# Patient Record
Sex: Male | Born: 1975
Health system: Southern US, Community
[De-identification: ages and names within clinical notes are randomized; demographics above are authoritative.]

## PROBLEM LIST (undated history)

## (undated) DIAGNOSIS — F32A Depression, unspecified: Secondary | ICD-10-CM

## (undated) DIAGNOSIS — F419 Anxiety disorder, unspecified: Secondary | ICD-10-CM

## (undated) DIAGNOSIS — F329 Major depressive disorder, single episode, unspecified: Secondary | ICD-10-CM

## (undated) HISTORY — DX: Anxiety disorder, unspecified: F41.9

## (undated) HISTORY — DX: Major depressive disorder, single episode, unspecified: F32.9

## (undated) HISTORY — DX: Depression, unspecified: F32.A

## (undated) HISTORY — PX: NO PAST SURGERIES: SHX2092

---

## 2004-01-03 ENCOUNTER — Emergency Department (HOSPITAL_COMMUNITY): Admission: EM | Admit: 2004-01-03 | Discharge: 2004-01-03 | Payer: Self-pay | Admitting: Emergency Medicine

## 2005-02-07 ENCOUNTER — Observation Stay (HOSPITAL_COMMUNITY): Admission: EM | Admit: 2005-02-07 | Discharge: 2005-02-08 | Payer: Self-pay | Admitting: Emergency Medicine

## 2005-02-08 ENCOUNTER — Ambulatory Visit: Payer: Self-pay | Admitting: Psychiatry

## 2005-02-08 ENCOUNTER — Inpatient Hospital Stay (HOSPITAL_COMMUNITY): Admission: RE | Admit: 2005-02-08 | Discharge: 2005-02-11 | Payer: Self-pay | Admitting: Psychiatry

## 2013-02-13 DIAGNOSIS — F419 Anxiety disorder, unspecified: Secondary | ICD-10-CM | POA: Insufficient documentation

## 2018-02-18 ENCOUNTER — Ambulatory Visit: Payer: BLUE CROSS/BLUE SHIELD | Admitting: Sports Medicine

## 2018-02-18 ENCOUNTER — Encounter: Payer: Self-pay | Admitting: Sports Medicine

## 2018-02-18 VITALS — BP 121/78 | HR 67 | Resp 16 | Ht 72.0 in | Wt 199.0 lb

## 2018-02-18 DIAGNOSIS — Z0000 Encounter for general adult medical examination without abnormal findings: Secondary | ICD-10-CM | POA: Diagnosis not present

## 2018-02-18 DIAGNOSIS — Z23 Encounter for immunization: Secondary | ICD-10-CM

## 2018-02-18 DIAGNOSIS — F411 Generalized anxiety disorder: Secondary | ICD-10-CM

## 2018-02-18 NOTE — Assessment & Plan Note (Signed)
Tdap today, declines flu shot, routine labs ordered. Return in 1 year for an annual physical.

## 2018-02-18 NOTE — Assessment & Plan Note (Addendum)
Continue Lexapro, he is unsure of his dose, he is going to get home and let me know.  I am happy to give him a year supply. Historically prescribed by psychiatrist, I think this is well within our realm to manage. Looking at his scores it does look like we have room for improvement.

## 2018-02-18 NOTE — Addendum Note (Signed)
Addended by: Baird KayUGLAS, [place] Kinnett M on: 02/18/2018 05:09 PM   Modules accepted: Orders

## 2018-02-18 NOTE — Progress Notes (Addendum)
  Subjective:    CC: Establish care.   HPI:  This is a pleasant 42 year old male, he really has no complaints, he has not seen a physician in years, and would like to establish care.  He has no complaints, he does take Lexapro for some generalized anxiety, prescribed by psychiatrist, well-controlled, no suicidal or homicidal ideation.  No depressed mood.  I reviewed the past medical history, family history, social history, surgical history, and allergies today and no changes were needed.  Please see the problem list section below in epic for further details.  Past Medical History: Past Medical History:  Diagnosis Date  . Anxiety and depression    Past Surgical History: Past Surgical History:  Procedure Laterality Date  . NO PAST SURGERIES     Social History: Social History   Socioeconomic History  . Marital status: Married    Spouse name: None  . Number of children: None  . Years of education: None  . Highest education level: None  Social Needs  . Financial resource strain: None  . Food insecurity - worry: None  . Food insecurity - inability: None  . Transportation needs - medical: None  . Transportation needs - non-medical: None  Occupational History  . None  Tobacco Use  . Smoking status: None  Substance and Sexual Activity  . Alcohol use: None  . Drug use: None  . Sexual activity: None  Other Topics Concern  . None  Social History Narrative  . None   Family History: History reviewed. No pertinent family history. Allergies: Not on File Medications: See med rec.  Review of Systems: No headache, visual changes, nausea, vomiting, diarrhea, constipation, dizziness, abdominal pain, skin rash, fevers, chills, night sweats, swollen lymph nodes, weight loss, chest pain, body aches, joint swelling, muscle aches, shortness of breath, mood changes, visual or auditory hallucinations.  Objective:    General: Well Developed, well nourished, and in no acute distress.    Neuro: Alert and oriented x3, extra-ocular muscles intact, sensation grossly intact. Cranial nerves II through XII are intact, motor, sensory, and coordinative functions are all intact. HEENT: Normocephalic, atraumatic, pupils equal round reactive to light, neck supple, no masses, no lymphadenopathy, thyroid nonpalpable. Oropharynx, nasopharynx, external ear canals are unremarkable. Skin: Warm and dry, no rashes noted.  Cardiac: Regular rate and rhythm, no murmurs rubs or gallops.  Respiratory: Clear to auscultation bilaterally. Not using accessory muscles, speaking in full sentences.  Abdominal: Soft, nontender, nondistended, positive bowel sounds, no masses, no organomegaly.  Musculoskeletal: Shoulder, elbow, wrist, hip, knee, ankle stable, and with full range of motion.  Impression and Recommendations:    The patient was counselled, risk factors were discussed, anticipatory guidance given.  Anxiety, generalized Continue Lexapro, he is unsure of his dose, he is going to get home and let me know.  I am happy to give him a year supply. Historically prescribed by psychiatrist, I think this is well within our realm to manage. Looking at his scores it does look like we have room for improvement.  Annual physical exam Tdap today, declines flu shot, routine labs ordered. Return in 1 year for an annual physical. ___________________________________________ Ihor Austinhomas J. Benjamin Stainhekkekandam, M.D., ABFM., CAQSM. Primary Care and Sports Medicine [facility]  Adjunct Instructor of Family Medicine  [employer]

## 2018-03-05 LAB — COMPREHENSIVE METABOLIC PANEL
AG Ratio: 2.2 (calc) (ref 1.0–2.5)
ALT: 14 U/L (ref 9–46)
AST: 15 U/L (ref 10–40)
Albumin: 4.3 g/dL (ref 3.6–5.1)
Alkaline phosphatase (APISO): 50 U/L (ref 40–115)
BUN: 22 mg/dL (ref 7–25)
CO2: 28 mmol/L (ref 20–32)
Calcium: 9.1 mg/dL (ref 8.6–10.3)
Chloride: 104 mmol/L (ref 98–110)
Creat: 0.99 mg/dL (ref 0.60–1.35)
Globulin: 2 g/dL (calc) (ref 1.9–3.7)
Glucose, Bld: 98 mg/dL (ref 65–99)
Potassium: 4.5 mmol/L (ref 3.5–5.3)
Sodium: 137 mmol/L (ref 135–146)
Total Bilirubin: 0.3 mg/dL (ref 0.2–1.2)
Total Protein: 6.3 g/dL (ref 6.1–8.1)

## 2018-03-05 LAB — CBC
HCT: 40.1 % (ref 38.5–50.0)
Hemoglobin: 13.4 g/dL (ref 13.2–17.1)
MCH: 29 pg (ref 27.0–33.0)
MCHC: 33.4 g/dL (ref 32.0–36.0)
MCV: 86.8 fL (ref 80.0–100.0)
MPV: 12.1 fL (ref 7.5–12.5)
Platelets: 223 10*3/uL (ref 140–400)
RBC: 4.62 10*6/uL (ref 4.20–5.80)
RDW: 11.7 % (ref 11.0–15.0)
WBC: 4.7 10*3/uL (ref 3.8–10.8)

## 2018-03-05 LAB — TESTOSTERONE, FREE & TOTAL
Free Testosterone: 62.4 pg/mL (ref 35.0–155.0)
Testosterone, Total, LC-MS-MS: 390 ng/dL (ref 250–1100)

## 2018-03-05 LAB — HEMOGLOBIN A1C
Hgb A1c MFr Bld: 5.1 % of total Hgb (ref <5.7)
Mean Plasma Glucose: 100 (calc)
eAG (mmol/L): 5.5 (calc)

## 2018-03-05 LAB — LIPID PANEL W/REFLEX DIRECT LDL
Cholesterol: 209 mg/dL — ABNORMAL HIGH (ref <200)
HDL: 51 mg/dL (ref >40)
LDL Cholesterol (Calc): 125 mg/dL (calc) — ABNORMAL HIGH
Non-HDL Cholesterol (Calc): 158 mg/dL (calc) — ABNORMAL HIGH (ref <130)
Total CHOL/HDL Ratio: 4.1 (calc) (ref <5.0)
Triglycerides: 213 mg/dL — ABNORMAL HIGH (ref <150)

## 2018-03-05 LAB — TSH: TSH: 2.77 mIU/L (ref 0.40–4.50)

## 2018-03-05 LAB — VITAMIN D 25 HYDROXY (VIT D DEFICIENCY, FRACTURES): Vit D, 25-Hydroxy: 26 ng/mL — ABNORMAL LOW (ref 30–100)

## 2018-03-06 MED ORDER — VITAMIN D (ERGOCALCIFEROL) 1.25 MG (50000 UNIT) PO CAPS
50000.0000 [IU] | ORAL_CAPSULE | ORAL | 0 refills | Status: DC
Start: 2018-03-06 — End: 2019-07-28

## 2018-03-06 NOTE — Addendum Note (Signed)
Addended by: Monica BectonHEKKEKANDAM, THOMAS J on: 03/06/2018 11:57 AM   Modules accepted: Orders

## 2018-05-06 ENCOUNTER — Other Ambulatory Visit: Payer: Self-pay | Admitting: Sports Medicine

## 2018-10-08 ENCOUNTER — Telehealth: Payer: Self-pay

## 2018-10-08 MED ORDER — ESCITALOPRAM OXALATE 10 MG PO TABS: 10.0000 mg | ORAL_TABLET | Freq: Every day | ORAL | 3 refills | Status: DC

## 2018-10-08 NOTE — Telephone Encounter (Signed)
Done

## 2018-10-08 NOTE — Telephone Encounter (Signed)
New patient to Dr T., wanting to know if Dr Judieth Keens take over his RX for Lexapro 10mg , 1 QD   [facility]ing [facility]. RX pended for approval

## 2019-01-25 ENCOUNTER — Other Ambulatory Visit: Payer: Self-pay | Admitting: Sports Medicine

## 2019-07-28 ENCOUNTER — Encounter: Payer: Self-pay | Admitting: Sports Medicine

## 2019-07-28 ENCOUNTER — Ambulatory Visit (INDEPENDENT_AMBULATORY_CARE_PROVIDER_SITE_OTHER): Payer: Self-pay | Admitting: Sports Medicine

## 2019-07-28 ENCOUNTER — Other Ambulatory Visit: Payer: Self-pay

## 2019-07-28 DIAGNOSIS — Z0000 Encounter for general adult medical examination without abnormal findings: Secondary | ICD-10-CM

## 2019-07-28 DIAGNOSIS — F411 Generalized anxiety disorder: Secondary | ICD-10-CM

## 2019-07-28 MED ORDER — SERTRALINE HCL 50 MG PO TABS: ORAL_TABLET | ORAL | 3 refills | Status: DC

## 2019-07-28 NOTE — Patient Instructions (Signed)
1 tab of Lexapro daily for 4 days then one half tab daily for 4 days then stop.  Starting Zoloft one half tab daily for 4 days and then 1 tab p.o. daily.

## 2019-07-28 NOTE — Progress Notes (Signed)
Subjective:    CC: Follow-up  HPI: Tyler Murray is a pleasant 43 year old male with severe anxiety, he has been historically stable and well-controlled on Lexapro 20.  Recently [place] had a breakdown[place].  No suicidal or homicidal ideation, his anxiety is worsening with the current pandemic.  I reviewed the past medical history, family history, social history, surgical history, and allergies today and no changes were needed.  Please see the problem list section below in epic for further details.  Past Medical History: Past Medical History:  Diagnosis Date  . Anxiety and depression    Past Surgical History: Past Surgical History:  Procedure Laterality Date  . NO PAST SURGERIES     Social History: Social History   Socioeconomic History  . Marital status: Unknown    Spouse name: Not on file  . Number of children: 2  . Years of education: 912  . Highest education level: Not on file  Occupational History  . Occupation: Leisure centre managerBartender  Social Needs  . Financial resource strain: Not on file  . Food insecurity    Worry: Not on file    Inability: Not on file  . Transportation needs    Medical: Not on file    Non-medical: Not on file  Tobacco Use  . Smoking status: Never Smoker  . Smokeless tobacco: Never Used  Substance and Sexual Activity  . Alcohol use: Yes  . Drug use: No  . Sexual activity: Yes    Birth control/protection: None  Lifestyle  . Physical activity    Days per week: Not on file    Minutes per session: Not on file  . Stress: Not on file  Relationships  . Social Musicianconnections    Talks on phone: Not on file    Gets together: Not on file    Attends religious service: Not on file    Active member of club or organization: Not on file    Attends meetings of clubs or organizations: Not on file    Relationship status: Not on file  Other Topics Concern  . Not on file  Social History Narrative  . Not on file   Family History:  Family History  Problem Relation Age of Onset  . Depression Father    Allergies: No Known Allergies Medications: See med rec.  Review of Systems: No fevers, chills, night sweats, weight loss, chest pain, or shortness of breath.   Objective:    General: Well Developed, well nourished, and in no acute distress.  Neuro: Alert and oriented x3, extra-ocular muscles intact, sensation grossly intact.  HEENT: Normocephalic, atraumatic, pupils equal round reactive to light, neck supple, no masses, no lymphadenopathy, thyroid nonpalpable.  Skin: Warm and dry, no rashes. Cardiac: Regular rate and rhythm, no murmurs rubs or gallops, no lower extremity edema.  Respiratory: Clear to auscultation bilaterally. Not using accessory muscles, speaking in full sentences.  Impression and Recommendations:    Anxiety, generalized Starting to develop some tachyphylaxis with Lexapro. He is doing a full 20 mg 2 tabs daily. We are to drop him down and switch him to Zoloft. He will do 1 tab of Lexapro daily for 4 days then one half tab daily for 4 days then stop. Starting Zoloft one half tab daily for 4 days and then 1 tab p.o. daily. Return in 6 weeks for repeat PHQ and GAD.  I spent 25 minutes with this patient, greater than 50% was face-to-face time counseling regarding the  above diagnoses.  ___________________________________________ Gwen Her. Dianah Field, M.D., ABFM., CAQSM. Primary Care and Sports Medicine [facility]  Adjunct Professor of [place] [employer]

## 2019-07-28 NOTE — Assessment & Plan Note (Signed)
Starting to develop some tachyphylaxis with Lexapro. He is doing a full 20 mg 2 tabs daily. We are to drop him down and switch him to Zoloft. He will do 1 tab of Lexapro daily for 4 days then one half tab daily for 4 days then stop. Starting Zoloft one half tab daily for 4 days and then 1 tab p.o. daily. Return in 6 weeks for repeat PHQ and GAD.

## 2019-07-29 ENCOUNTER — Emergency Department (INDEPENDENT_AMBULATORY_CARE_PROVIDER_SITE_OTHER)
Admission: EM | Admit: 2019-07-29 | Discharge: 2019-07-29 | Disposition: A | Discharge status: Home / Self Care | Payer: Self-pay | Source: Home / Self Care

## 2019-07-29 ENCOUNTER — Encounter: Payer: Self-pay | Admitting: *Deleted

## 2019-07-29 ENCOUNTER — Other Ambulatory Visit: Payer: Self-pay

## 2019-07-29 ENCOUNTER — Emergency Department (INDEPENDENT_AMBULATORY_CARE_PROVIDER_SITE_OTHER): Payer: Self-pay

## 2019-07-29 DIAGNOSIS — M549 Dorsalgia, unspecified: Secondary | ICD-10-CM

## 2019-07-29 DIAGNOSIS — M79642 Pain in left hand: Secondary | ICD-10-CM

## 2019-07-29 DIAGNOSIS — S6392XA Sprain of unspecified part of left wrist and hand, initial encounter: Secondary | ICD-10-CM

## 2019-07-29 DIAGNOSIS — S63602A Unspecified sprain of left thumb, initial encounter: Secondary | ICD-10-CM

## 2019-07-29 LAB — COMPLETE METABOLIC PANEL WITH GFR
AG Ratio: 2.4 (calc) (ref 1.0–2.5)
ALT: 14 U/L (ref 9–46)
AST: 15 U/L (ref 10–40)
Albumin: 4.4 g/dL (ref 3.6–5.1)
Alkaline phosphatase (APISO): 45 U/L (ref 36–130)
BUN: 14 mg/dL (ref 7–25)
CO2: 27 mmol/L (ref 20–32)
Calcium: 9.4 mg/dL (ref 8.6–10.3)
Chloride: 105 mmol/L (ref 98–110)
Creat: 0.86 mg/dL (ref 0.60–1.35)
GFR, Est African American: 124 mL/min/{1.73_m2} (ref >=60)
GFR, Est Non African American: 107 mL/min/{1.73_m2} (ref >=60)
Globulin: 1.8 g/dL (calc) — ABNORMAL LOW (ref 1.9–3.7)
Glucose, Bld: 105 mg/dL — ABNORMAL HIGH (ref 65–99)
Potassium: 4.7 mmol/L (ref 3.5–5.3)
Sodium: 140 mmol/L (ref 135–146)
Total Bilirubin: 0.5 mg/dL (ref 0.2–1.2)
Total Protein: 6.2 g/dL (ref 6.1–8.1)

## 2019-07-29 LAB — CBC
HCT: 40.4 % (ref 38.5–50.0)
Hemoglobin: 13.4 g/dL (ref 13.2–17.1)
MCH: 29.8 pg (ref 27.0–33.0)
MCHC: 33.2 g/dL (ref 32.0–36.0)
MCV: 89.8 fL (ref 80.0–100.0)
MPV: 12.4 fL (ref 7.5–12.5)
Platelets: 216 10*3/uL (ref 140–400)
RBC: 4.5 10*6/uL (ref 4.20–5.80)
RDW: 11.8 % (ref 11.0–15.0)
WBC: 3.9 10*3/uL (ref 3.8–10.8)

## 2019-07-29 LAB — LIPID PANEL W/REFLEX DIRECT LDL
Cholesterol: 236 mg/dL — ABNORMAL HIGH (ref <200)
HDL: 56 mg/dL (ref >=40)
LDL Cholesterol (Calc): 153 mg/dL (calc) — ABNORMAL HIGH
Non-HDL Cholesterol (Calc): 180 mg/dL (calc) — ABNORMAL HIGH (ref <130)
Total CHOL/HDL Ratio: 4.2 (calc) (ref <5.0)
Triglycerides: 143 mg/dL (ref <150)

## 2019-07-29 LAB — TSH: TSH: 1.93 mIU/L (ref 0.40–4.50)

## 2019-07-29 LAB — HEMOGLOBIN A1C
Hgb A1c MFr Bld: 5.2 % of total Hgb (ref <5.7)
Mean Plasma Glucose: 103 (calc)
eAG (mmol/L): 5.7 (calc)

## 2019-07-29 LAB — VITAMIN D 25 HYDROXY (VIT D DEFICIENCY, FRACTURES): Vit D, 25-Hydroxy: 33 ng/mL (ref 30–100)

## 2019-07-29 MED ORDER — CYCLOBENZAPRINE HCL 5 MG PO TABS: 5.0000 mg | ORAL_TABLET | Freq: Two times a day (BID) | ORAL | 0 refills | Status: DC | PRN

## 2019-07-29 NOTE — Discharge Instructions (Signed)
°  Flexeril (cyclobenzaprine) is a muscle relaxer and may cause drowsiness. Do not drink alcohol, drive, or operate heavy machinery while taking.  You may take 500mg  acetaminophen every 4-6 hours or in combination with ibuprofen 400-600mg  every 6-8 hours as needed for pain and inflammation.  Use caution when taking the ibuprofen with your Zoloft as it can increase your risk of a stomach ulcer or bleed.  Be sure to take with food.   Please call to schedule a follow up appointment with Dr. Dianah Field, Sports Medicine, later this week or early next week if hand/thumb pain not improving as additional imaging or treatment may be indicated.
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
## 2019-07-30 ENCOUNTER — Telehealth: Payer: Self-pay

## 2019-07-30 DIAGNOSIS — F411 Generalized anxiety disorder: Secondary | ICD-10-CM

## 2019-07-30 MED ORDER — ALPRAZOLAM 0.25 MG PO TABS: 0.2500 mg | ORAL_TABLET | Freq: Every day | ORAL | 0 refills | Status: DC | PRN

## 2019-07-30 NOTE — Telephone Encounter (Signed)
Patient has been advised. Rhonda Cunningham,CMA  

## 2019-07-30 NOTE — Telephone Encounter (Signed)
Definitely, I must of forgot to send it in.

## 2019-07-30 NOTE — Telephone Encounter (Signed)
Patient called stating he thought that Dr T told him he was going to send him some Xanax to the pharmacy or breakthrough anxiety.   [facility]ing [facility]

## 2019-08-14 ENCOUNTER — Encounter: Payer: Self-pay | Admitting: Sports Medicine

## 2019-08-14 ENCOUNTER — Ambulatory Visit (INDEPENDENT_AMBULATORY_CARE_PROVIDER_SITE_OTHER): Payer: Self-pay | Admitting: Sports Medicine

## 2019-08-14 ENCOUNTER — Other Ambulatory Visit: Payer: Self-pay

## 2019-08-14 ENCOUNTER — Ambulatory Visit (INDEPENDENT_AMBULATORY_CARE_PROVIDER_SITE_OTHER): Payer: Self-pay

## 2019-08-14 DIAGNOSIS — S6992XA Unspecified injury of left wrist, hand and finger(s), initial encounter: Secondary | ICD-10-CM

## 2019-08-14 MED ORDER — CELECOXIB 200 MG PO CAPS: ORAL_CAPSULE | ORAL | 2 refills | Status: DC
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
## 2019-08-14 NOTE — Addendum Note (Signed)
Addended by: Silverio Decamp on: 08/14/2019 04:44 PM   Modules accepted: Orders

## 2019-08-20 ENCOUNTER — Other Ambulatory Visit: Payer: Self-pay | Admitting: Sports Medicine

## 2019-08-20 DIAGNOSIS — F411 Generalized anxiety disorder: Secondary | ICD-10-CM

## 2019-09-03 ENCOUNTER — Other Ambulatory Visit: Payer: Self-pay | Admitting: Sports Medicine

## 2019-09-03 DIAGNOSIS — F411 Generalized anxiety disorder: Secondary | ICD-10-CM

## 2019-09-03 NOTE — Telephone Encounter (Signed)
[facility]  

## 2019-09-08 ENCOUNTER — Ambulatory Visit (INDEPENDENT_AMBULATORY_CARE_PROVIDER_SITE_OTHER): Payer: BLUE CROSS/BLUE SHIELD | Admitting: Sports Medicine

## 2019-09-08 ENCOUNTER — Other Ambulatory Visit: Payer: Self-pay

## 2019-09-08 ENCOUNTER — Encounter: Payer: Self-pay | Admitting: Sports Medicine

## 2019-09-08 DIAGNOSIS — S6992XD Unspecified injury of left wrist, hand and finger(s), subsequent encounter: Secondary | ICD-10-CM | POA: Diagnosis not present

## 2019-09-08 DIAGNOSIS — F411 Generalized anxiety disorder: Secondary | ICD-10-CM

## 2019-09-08 MED ORDER — ALPRAZOLAM 0.5 MG PO TABS: 0.5000 mg | ORAL_TABLET | Freq: Every day | ORAL | 0 refills | Status: DC | PRN

## 2019-09-08 MED ORDER — SERTRALINE HCL 100 MG PO TABS: 100.0000 mg | ORAL_TABLET | Freq: Every day | ORAL | 3 refills | Status: DC

## 2019-09-08 NOTE — Assessment & Plan Note (Signed)
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 We can certainly inject in the future if needed.

## 2019-09-08 NOTE — Progress Notes (Signed)
  Subjective:    CC: Follow-up  HPI: Tyler Murray returns, he is a pleasant 43 year old male, we are treating her for generalized anxiety, things are improving on low-dose sertraline, he understands that we have some more work to do.  His thumb has improved.  I reviewed the past medical history, family history, social history, surgical history, and allergies today and no changes were needed.  Please see the problem list section below in epic for further details.  Past Medical History: Past Medical History:  Diagnosis Date  . Anxiety and depression    Past Surgical History: Past Surgical History:  Procedure Laterality Date  . NO PAST SURGERIES     Social History: Social History   Socioeconomic History  . Marital status: Single    Spouse name: Not on file  . Number of children: 2  . Years of education: 83  . Highest education level: Not on file  Occupational History  . Occupation: Chief Operating Officer  Social Needs  . Financial resource strain: Not on file  . Food insecurity    Worry: Not on file    Inability: Not on file  . Transportation needs    Medical: Not on file    Non-medical: Not on file  Tobacco Use  . Smoking status: Never Smoker  . Smokeless tobacco: Never Used  Substance and Sexual Activity  . Alcohol use: Yes  . Drug use: No  . Sexual activity: Yes    Birth control/protection: None  Lifestyle  . Physical activity    Days per week: Not on file    Minutes per session: Not on file  . Stress: Not on file  Relationships  . Social Herbalist on phone: Not on file    Gets together: Not on file    Attends religious service: Not on file    Active member of club or organization: Not on file    Attends meetings of clubs or organizations: Not on file    Relationship status: Not on file  Other Topics Concern  . Not on file  Social History Narrative  . Not on file   Family History: Family History  Problem Relation Age of Onset  . Depression Father     Allergies: No Known Allergies Medications: See med rec.  Review of Systems: No fevers, chills, night sweats, weight loss, chest pain, or shortness of breath.   Objective:    General: Well Developed, well nourished, and in no acute distress.  Neuro: Alert and oriented x3, extra-ocular muscles intact, sensation grossly intact.  HEENT: Normocephalic, atraumatic, pupils equal round reactive to light, neck supple, no masses, no lymphadenopathy, thyroid nonpalpable.  Skin: Warm and dry, no rashes. Cardiac: Regular rate and rhythm, no murmurs rubs or gallops, no lower extremity edema.  Respiratory: Clear to auscultation bilaterally. Not using accessory muscles, speaking in full sentences.  Impression and Recommendations:    Anxiety, generalized Good improvements with Zoloft 50, increasing to 100 mg, increasing alprazolam 0.5 mg as needed. Return to see me in 6 weeks to reevaluate.  Injury of hand, left CMC arthritis on CT, pain has resolved with celecoxib. We can certainly inject in the future if needed.   ___________________________________________ Gwen Her. Dianah Field, M.D., ABFM., CAQSM. Primary Care and Sports Medicine [facility]  Adjunct Professor of [place] [employer] [facility] [employer]

## 2019-09-08 NOTE — Assessment & Plan Note (Signed)
Good improvements with Zoloft 50, increasing to 100 mg, increasing alprazolam 0.5 mg as needed. Return to see me in 6 weeks to reevaluate.

## 2019-09-29 ENCOUNTER — Other Ambulatory Visit: Payer: Self-pay

## 2019-09-29 DIAGNOSIS — Z20822 Contact with and (suspected) exposure to covid-19: Secondary | ICD-10-CM

## 2019-10-01 LAB — NOVEL CORONAVIRUS, NAA: SARS-CoV-2, NAA: NOT DETECTED

## 2019-10-03 ENCOUNTER — Other Ambulatory Visit: Payer: Self-pay | Admitting: Sports Medicine

## 2019-10-03 DIAGNOSIS — F411 Generalized anxiety disorder: Secondary | ICD-10-CM

## 2019-10-20 ENCOUNTER — Other Ambulatory Visit: Payer: Self-pay

## 2019-10-20 DIAGNOSIS — Z20822 Contact with and (suspected) exposure to covid-19: Secondary | ICD-10-CM

## 2019-10-21 ENCOUNTER — Other Ambulatory Visit: Payer: Self-pay | Admitting: Sports Medicine

## 2019-10-22 ENCOUNTER — Encounter: Payer: Self-pay | Admitting: Sports Medicine

## 2019-10-22 ENCOUNTER — Other Ambulatory Visit: Payer: Self-pay | Admitting: Sports Medicine

## 2019-10-22 DIAGNOSIS — F411 Generalized anxiety disorder: Secondary | ICD-10-CM

## 2019-10-22 LAB — NOVEL CORONAVIRUS, NAA: SARS-CoV-2, NAA: NOT DETECTED

## 2019-10-31 ENCOUNTER — Ambulatory Visit (INDEPENDENT_AMBULATORY_CARE_PROVIDER_SITE_OTHER): Payer: BLUE CROSS/BLUE SHIELD | Admitting: Sports Medicine

## 2019-10-31 ENCOUNTER — Encounter: Payer: Self-pay | Admitting: Sports Medicine

## 2019-10-31 DIAGNOSIS — F411 Generalized anxiety disorder: Secondary | ICD-10-CM | POA: Diagnosis not present

## 2019-10-31 MED ORDER — ALPRAZOLAM 0.5 MG PO TABS: 0.5000 mg | ORAL_TABLET | Freq: Every day | ORAL | 0 refills | Status: DC | PRN

## 2019-10-31 MED ORDER — SERTRALINE HCL 50 MG PO TABS: 150.0000 mg | ORAL_TABLET | Freq: Every day | ORAL | 3 refills | Status: DC

## 2019-10-31 NOTE — Assessment & Plan Note (Signed)
Feeling okay but still a little bit on edge, increasing to Zoloft 150, refilling alprazolam, PHQ/GAD in a month.

## 2019-10-31 NOTE — Progress Notes (Signed)
Virtual Visit via WebEx/MyChart   I connected with  Tyler Murray  on 10/31/19 via WebEx/MyChart/Doximity Video and verified that I am speaking with the correct person using two identifiers.   I discussed the limitations, risks, security and privacy concerns of performing an evaluation and management service by WebEx/MyChart/Doximity Video, including the higher likelihood of inaccurate diagnosis and treatment, and the availability of in person appointments.  We also discussed the likely need of an additional face to face encounter for complete and high quality delivery of care.  I also discussed with the patient that there may be a patient responsible charge related to this service. The patient expressed understanding and wishes to proceed.  Provider location is either at home or medical facility. Patient location is at their home, different from provider location. People involved in care of the patient during this telehealth encounter were myself, my nurse/medical assistant, and my front office/scheduling team member.  Subjective:    CC: Follow-up  HPI: Tyler Murray returns, he is doing better with Zoloft but still has some sensations of being on edge, see below for further details.  I reviewed the past medical history, family history, social history, surgical history, and allergies today and no changes were needed.  Please see the problem list section below in epic for further details.  Past Medical History: Past Medical History:  Diagnosis Date  . Anxiety and depression    Past Surgical History: Past Surgical History:  Procedure Laterality Date  . NO PAST SURGERIES     Social History: Social History   Socioeconomic History  . Marital status: Single    Spouse name: Not on file  . Number of children: 2  . Years of education: 77  . Highest education level: Not on file  Occupational History  . Occupation: Leisure centre manager  Social Needs  . Financial resource strain: Not on file  . Food  insecurity    Worry: Not on file    Inability: Not on file  . Transportation needs    Medical: Not on file    Non-medical: Not on file  Tobacco Use  . Smoking status: Never Smoker  . Smokeless tobacco: Never Used  Substance and Sexual Activity  . Alcohol use: Yes  . Drug use: No  . Sexual activity: Yes    Birth control/protection: None  Lifestyle  . Physical activity    Days per week: Not on file    Minutes per session: Not on file  . Stress: Not on file  Relationships  . Social Musician on phone: Not on file    Gets together: Not on file    Attends religious service: Not on file    Active member of club or organization: Not on file    Attends meetings of clubs or organizations: Not on file    Relationship status: Not on file  Other Topics Concern  . Not on file  Social History Narrative  . Not on file   Family History: Family History  Problem Relation Age of Onset  . Depression Father    Allergies: No Known Allergies Medications: See med rec.  Review of Systems: No fevers, chills, night sweats, weight loss, chest pain, or shortness of breath.   Objective:    General: Speaking full sentences, no audible heavy breathing.  Sounds alert and appropriately interactive.  Appears well.  Face symmetric.  Extraocular movements intact.  Pupils equal and round.  No nasal flaring or accessory muscle use visualized.  No other physical exam performed due to the non-physical nature of this visit.  Impression and Recommendations:    Anxiety, generalized Feeling okay but still a little bit on edge, increasing to Zoloft 150, refilling alprazolam, PHQ/GAD in a month.   I discussed the above assessment and treatment plan with the patient. The patient was provided an opportunity to ask questions and all were answered. The patient agreed with the plan and demonstrated an understanding of the instructions.   The patient was advised to call back or seek an in-person  evaluation if the symptoms worsen or if the condition fails to improve as anticipated.   I provided 25 minutes of non-face-to-face time during this encounter, 15 minutes of additional time was needed to gather information, review chart, records, communicate/coordinate with staff remotely, troubleshooting the multiple errors that we get every time when trying to do video calls through the electronic medical record, WebEx, and Doximity, restart the encounter multiple times due to instability of the software, as well as complete documentation.   ___________________________________________ Gwen Her. Dianah Field, M.D., ABFM., CAQSM. Primary Care and Sports Medicine [facility]  Adjunct Professor of [place] [employer] [facility] [employer] Medicine

## 2019-11-22 ENCOUNTER — Other Ambulatory Visit: Payer: Self-pay | Admitting: Sports Medicine

## 2019-11-22 DIAGNOSIS — F411 Generalized anxiety disorder: Secondary | ICD-10-CM

## 2019-11-28 ENCOUNTER — Other Ambulatory Visit: Payer: Self-pay | Admitting: Sports Medicine

## 2019-11-28 DIAGNOSIS — F411 Generalized anxiety disorder: Secondary | ICD-10-CM

## 2019-12-25 ENCOUNTER — Other Ambulatory Visit: Payer: Self-pay | Admitting: Sports Medicine

## 2019-12-25 DIAGNOSIS — F411 Generalized anxiety disorder: Secondary | ICD-10-CM

## 2020-01-15 ENCOUNTER — Other Ambulatory Visit: Payer: Self-pay | Admitting: Sports Medicine

## 2020-01-15 DIAGNOSIS — F411 Generalized anxiety disorder: Secondary | ICD-10-CM

## 2020-02-05 ENCOUNTER — Other Ambulatory Visit: Payer: Self-pay | Admitting: Sports Medicine

## 2020-02-05 DIAGNOSIS — F411 Generalized anxiety disorder: Secondary | ICD-10-CM

## 2020-02-24 ENCOUNTER — Other Ambulatory Visit: Payer: Self-pay | Admitting: Sports Medicine

## 2020-02-24 DIAGNOSIS — F411 Generalized anxiety disorder: Secondary | ICD-10-CM

## 2020-03-10 ENCOUNTER — Other Ambulatory Visit: Payer: Self-pay | Admitting: Sports Medicine

## 2020-03-10 DIAGNOSIS — F411 Generalized anxiety disorder: Secondary | ICD-10-CM
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
## 2020-04-05 ENCOUNTER — Other Ambulatory Visit: Payer: Self-pay | Admitting: Sports Medicine

## 2020-04-05 DIAGNOSIS — F411 Generalized anxiety disorder: Secondary | ICD-10-CM

## 2020-04-26 ENCOUNTER — Telehealth (INDEPENDENT_AMBULATORY_CARE_PROVIDER_SITE_OTHER): Payer: BLUE CROSS/BLUE SHIELD | Admitting: Sports Medicine

## 2020-04-26 DIAGNOSIS — F411 Generalized anxiety disorder: Secondary | ICD-10-CM

## 2020-04-26 MED ORDER — SERTRALINE HCL 50 MG PO TABS: 150.0000 mg | ORAL_TABLET | Freq: Every day | ORAL | 3 refills | Status: DC

## 2020-04-26 NOTE — Assessment & Plan Note (Signed)
This is a very pleasant 44 year old male, he has known generalized anxiety, historically under control, he tells me he was likely taking the wrong dose and ran out of his medication. Refilling Zoloft, he does not desire a refill on present. He can see me in August for a physical.

## 2020-04-26 NOTE — Progress Notes (Signed)
   Virtual Visit via WebEx/MyChart   I connected with  Jalil Lorusso  on 04/26/20 via WebEx/MyChart/Doximity Video and verified that I am speaking with the correct person using two identifiers.   I discussed the limitations, risks, security and privacy concerns of performing an evaluation and management service by WebEx/MyChart/Doximity Video, including the higher likelihood of inaccurate diagnosis and treatment, and the availability of in person appointments.  We also discussed the likely need of an additional face to face encounter for complete and high quality delivery of care.  I also discussed with the patient that there may be a patient responsible charge related to this service. The patient expressed understanding and wishes to proceed.  Provider location is either at home or medical facility. Patient location is at their home, different from provider location. People involved in care of the patient during this telehealth encounter were myself, my nurse/medical assistant, and my front office/scheduling team member.  Review of Systems: No fevers, chills, night sweats, weight loss, chest pain, or shortness of breath.   Objective Findings:    General: Speaking full sentences, no audible heavy breathing.  Sounds alert and appropriately interactive.  Appears well.  Face symmetric.  Extraocular movements intact.  Pupils equal and round.  No nasal flaring or accessory muscle use visualized.  Independent interpretation of tests performed by another provider:   None.  Brief History, Exam, Impression, and Recommendations:    Anxiety, generalized This is a very pleasant 44 year old male, he has known generalized anxiety, historically under control, he tells me he was likely taking the wrong dose and ran out of his medication. Refilling Zoloft, he does not desire a refill on present. He can see me in August for a physical.   I discussed the above assessment and treatment plan with the  patient. The patient was provided an opportunity to ask questions and all were answered. The patient agreed with the plan and demonstrated an understanding of the instructions.   The patient was advised to call back or seek an in-person evaluation if the symptoms worsen or if the condition fails to improve as anticipated.   I provided 30 minutes of face to face and non-face-to-face time during this encounter date, time was needed to gather information, review chart, records, communicate/coordinate with staff remotely, as well as complete documentation.   ___________________________________________ Ihor Austin. Benjamin Stain, M.D., ABFM., CAQSM. Primary Care and Sports Medicine [facility]  Adjunct Instructor of Family Medicine  [employer] [facility] [employer] [facility] [employer]

## 2020-05-03 ENCOUNTER — Other Ambulatory Visit: Payer: Self-pay | Admitting: Sports Medicine

## 2020-05-03 DIAGNOSIS — F411 Generalized anxiety disorder: Secondary | ICD-10-CM

## 2020-06-02 ENCOUNTER — Other Ambulatory Visit: Payer: Self-pay | Admitting: Sports Medicine

## 2020-06-02 DIAGNOSIS — F411 Generalized anxiety disorder: Secondary | ICD-10-CM

## 2020-06-16 ENCOUNTER — Other Ambulatory Visit: Payer: Self-pay | Admitting: Sports Medicine

## 2020-06-16 DIAGNOSIS — F411 Generalized anxiety disorder: Secondary | ICD-10-CM

## 2020-07-21 ENCOUNTER — Other Ambulatory Visit: Payer: Self-pay | Admitting: Sports Medicine

## 2020-07-21 DIAGNOSIS — F411 Generalized anxiety disorder: Secondary | ICD-10-CM

## 2020-08-25 ENCOUNTER — Other Ambulatory Visit: Payer: Self-pay | Admitting: Sports Medicine

## 2020-08-25 DIAGNOSIS — F411 Generalized anxiety disorder: Secondary | ICD-10-CM

## 2020-09-21 ENCOUNTER — Ambulatory Visit (INDEPENDENT_AMBULATORY_CARE_PROVIDER_SITE_OTHER): Payer: BLUE CROSS/BLUE SHIELD | Admitting: Sports Medicine

## 2020-09-21 ENCOUNTER — Encounter: Payer: Self-pay | Admitting: Sports Medicine

## 2020-09-21 VITALS — BP 132/93 | HR 79 | Ht 72.0 in | Wt 205.0 lb

## 2020-09-21 DIAGNOSIS — F411 Generalized anxiety disorder: Secondary | ICD-10-CM

## 2020-09-21 DIAGNOSIS — Z0000 Encounter for general adult medical examination without abnormal findings: Secondary | ICD-10-CM

## 2020-09-21 DIAGNOSIS — E559 Vitamin D deficiency, unspecified: Secondary | ICD-10-CM | POA: Diagnosis not present

## 2020-09-21 DIAGNOSIS — E038 Other specified hypothyroidism: Secondary | ICD-10-CM

## 2020-09-21 DIAGNOSIS — E782 Mixed hyperlipidemia: Secondary | ICD-10-CM

## 2020-09-21 DIAGNOSIS — G4719 Other hypersomnia: Secondary | ICD-10-CM | POA: Diagnosis not present

## 2020-09-21 DIAGNOSIS — G479 Sleep disorder, unspecified: Secondary | ICD-10-CM | POA: Insufficient documentation

## 2020-09-21 NOTE — Progress Notes (Addendum)
Subjective:    CC: Annual Physical Exam  HPI:  This patient is here for their annual physical  I reviewed the past medical history, family history, social history, surgical history, and allergies today and no changes were needed.  Please see the problem list section below in epic for further details.  Past Medical History: Past Medical History:  Diagnosis Date  . Anxiety and depression    Past Surgical History: Past Surgical History:  Procedure Laterality Date  . NO PAST SURGERIES     Social History: Social History   Socioeconomic History  . Marital status: Single    Spouse name: Not on file  . Number of children: 2  . Years of education: 18  . Highest education level: Not on file  Occupational History  . Occupation: Bartender  Tobacco Use  . Smoking status: Never Smoker  . Smokeless tobacco: Never Used  Substance and Sexual Activity  . Alcohol use: Yes  . Drug use: No  . Sexual activity: Yes    Birth control/protection: None  Other Topics Concern  . Not on file  Social History Narrative  . Not on file   Social Determinants of Health   Financial Resource Strain: Not on file  Food Insecurity: Not on file  Transportation Needs: Not on file  Physical Activity: Not on file  Stress: Not on file  Social Connections: Not on file   Family History: Family History  Problem Relation Age of Onset  . Depression Father    Allergies: No Known Allergies Medications: See med rec.  Review of Systems: No headache, visual changes, nausea, vomiting, diarrhea, constipation, dizziness, abdominal pain, skin rash, fevers, chills, night sweats, swollen lymph nodes, weight loss, chest pain, body aches, joint swelling, muscle aches, shortness of breath, mood changes, visual or auditory hallucinations.  Objective:    General: Well Developed, well nourished, and in no acute distress.  Neuro: Alert and oriented x3, extra-ocular muscles intact, sensation grossly intact. Cranial  nerves II through XII are intact, motor, sensory, and coordinative functions are all intact. HEENT: Normocephalic, atraumatic, pupils equal round reactive to light, neck supple, no masses, no lymphadenopathy, thyroid nonpalpable. Oropharynx, nasopharynx, external ear canals are unremarkable. Skin: Warm and dry, no rashes noted.  Cardiac: Regular rate and rhythm, no murmurs rubs or gallops.  Respiratory: Clear to auscultation bilaterally. Not using accessory muscles, speaking in full sentences.  Abdominal: Soft, nontender, nondistended, positive bowel sounds, no masses, no organomegaly.  Musculoskeletal: Shoulder, elbow, wrist, hip, knee, ankle stable, and with full range of motion.  Impression and Recommendations:    The patient was counselled, risk factors were discussed, anticipatory guidance given.  Annual physical exam Nihal returns, he is a very pleasant 44 year old male, here for his physical, exam is unremarkable.  Anxiety, generalized Kolin does report that his anxiety is well controlled with Zoloft, he does not yet need a refill.   Excessive daytime sleepiness Nichoals has also complained of being somewhat tired through the day and not waking well rested. His blood pressure is somewhat up today. We will pull the trigger for a home sleep study. He has high scores on the Epworth Sleepiness Scale.  Mixed hyperlipidemia Rosuvastatin sent in. Recheck lipids in 2-3 months.  Update 01/05/2021: Bumping up Crestor to 40 mg, he can double what he has until he runs out and then refill with the new dose, recheck lipids in 8-12 weeks.  Subclinical hypothyroidism Suspected with minimally elevated TSH, we will confirm with T3 and T4 levels.  If he is having some fatigue and weight gain we can likely start a low-dose of levothyroxine.  Patient has noted significant fatigue, starting 50 micrograms of levothyroxine. We should revisit this in a month to 6 weeks to see how things are  going.   ___________________________________________ Ihor Austin. Benjamin Stain, M.D., ABFM., CAQSM. Primary Care and Sports Medicine [facility]  Adjunct Professor of Family Medicine  [employer] [facility] [employer] [facility] [employer]

## 2020-09-21 NOTE — Assessment & Plan Note (Signed)
Bronsen has also complained of being somewhat tired through the day and not waking well rested. His blood pressure is somewhat up today. We will pull the trigger for a home sleep study. He has high scores on the Epworth Sleepiness Scale.

## 2020-09-21 NOTE — Assessment & Plan Note (Signed)
Tyler Murray does report that his anxiety is well controlled with Zoloft, he does not yet need a refill.

## 2020-09-21 NOTE — Assessment & Plan Note (Signed)
Tyler Murray returns, he is a very pleasant 44 year old male, here for his physical, exam is unremarkable.

## 2020-09-24 DIAGNOSIS — E782 Mixed hyperlipidemia: Secondary | ICD-10-CM | POA: Insufficient documentation

## 2020-09-24 DIAGNOSIS — E038 Other specified hypothyroidism: Secondary | ICD-10-CM | POA: Insufficient documentation

## 2020-09-24 MED ORDER — VITAMIN D (ERGOCALCIFEROL) 1.25 MG (50000 UNIT) PO CAPS: 50000.0000 [IU] | ORAL_CAPSULE | ORAL | 0 refills | Status: DC

## 2020-09-24 MED ORDER — ROSUVASTATIN CALCIUM 20 MG PO TABS: 20.0000 mg | ORAL_TABLET | Freq: Every day | ORAL | 3 refills | Status: DC

## 2020-09-24 NOTE — Assessment & Plan Note (Addendum)
Suspected with minimally elevated TSH, we will confirm with T3 and T4 levels. If he is having some fatigue and weight gain we can likely start a low-dose of levothyroxine.  Patient has noted significant fatigue, starting 50 micrograms of levothyroxine. We should revisit this in a month to 6 weeks to see how things are going.

## 2020-09-24 NOTE — Addendum Note (Signed)
Addended by: Monica Becton on: 09/24/2020 03:46 PM   Modules accepted: Orders

## 2020-09-24 NOTE — Addendum Note (Signed)
Addended by: Monica Becton on: 09/24/2020 08:28 AM   Modules accepted: Orders

## 2020-09-24 NOTE — Assessment & Plan Note (Addendum)
Rosuvastatin sent in. Recheck lipids in 2-3 months.  Update 01/05/2021: Bumping up Crestor to 40 mg, he can double what he has until he runs out and then refill with the new dose, recheck lipids in 8-12 weeks.

## 2020-09-27 ENCOUNTER — Other Ambulatory Visit: Payer: Self-pay | Admitting: Sports Medicine

## 2020-09-27 ENCOUNTER — Other Ambulatory Visit: Payer: Self-pay

## 2020-09-27 DIAGNOSIS — F411 Generalized anxiety disorder: Secondary | ICD-10-CM

## 2020-09-27 DIAGNOSIS — E782 Mixed hyperlipidemia: Secondary | ICD-10-CM

## 2020-09-27 MED ORDER — ALPRAZOLAM 0.5 MG PO TABS: 0.5000 mg | ORAL_TABLET | Freq: Every day | ORAL | 0 refills | Status: DC | PRN

## 2020-09-30 LAB — COMPREHENSIVE METABOLIC PANEL
AG Ratio: 2.3 (calc) (ref 1.0–2.5)
ALT: 20 U/L (ref 9–46)
AST: 16 U/L (ref 10–40)
Albumin: 4.8 g/dL (ref 3.6–5.1)
Alkaline phosphatase (APISO): 46 U/L (ref 36–130)
BUN: 16 mg/dL (ref 7–25)
CO2: 28 mmol/L (ref 20–32)
Calcium: 9.6 mg/dL (ref 8.6–10.3)
Chloride: 102 mmol/L (ref 98–110)
Creat: 0.96 mg/dL (ref 0.60–1.35)
Globulin: 2.1 g/dL (calc) (ref 1.9–3.7)
Glucose, Bld: 106 mg/dL — ABNORMAL HIGH (ref 65–99)
Sodium: 137 mmol/L (ref 135–146)
Total Bilirubin: 0.7 mg/dL (ref 0.2–1.2)
Total Protein: 6.9 g/dL (ref 6.1–8.1)

## 2020-09-30 LAB — CBC
HCT: 45.3 % (ref 38.5–50.0)
Hemoglobin: 15.1 g/dL (ref 13.2–17.1)
MCH: 30.1 pg (ref 27.0–33.0)
MCHC: 33.3 g/dL (ref 32.0–36.0)
MCV: 90.4 fL (ref 80.0–100.0)
MPV: 11.6 fL (ref 7.5–12.5)
Platelets: 223 10*3/uL (ref 140–400)
RBC: 5.01 10*6/uL (ref 4.20–5.80)
RDW: 11.6 % (ref 11.0–15.0)
WBC: 5.1 Thousand/uL (ref 3.8–10.8)

## 2020-09-30 LAB — T4,TOTAL(THYROXINE)IMMUNOASSAY: T4,Total(Thyroxine): 4.9 ug/dL (ref 4.8–10.4)

## 2020-09-30 LAB — LIPID PANEL
Cholesterol: 243 mg/dL — ABNORMAL HIGH (ref <200)
HDL: 60 mg/dL (ref >=40)
LDL Cholesterol (Calc): 152 mg/dL (calc) — ABNORMAL HIGH
Non-HDL Cholesterol (Calc): 183 mg/dL (calc) — ABNORMAL HIGH (ref <130)
Total CHOL/HDL Ratio: 4.1 (calc) (ref <5.0)
Triglycerides: 176 mg/dL — ABNORMAL HIGH (ref <150)

## 2020-09-30 LAB — TSH: TSH: 5.01 mIU/L — ABNORMAL HIGH (ref 0.40–4.50)

## 2020-09-30 LAB — T3: T3, Total: 92 ng/dL (ref 76–181)

## 2020-09-30 LAB — VITAMIN D 25 HYDROXY (VIT D DEFICIENCY, FRACTURES): Vit D, 25-Hydroxy: 26 ng/mL — ABNORMAL LOW (ref 30–100)

## 2020-09-30 LAB — COMPREHENSIVE METABOLIC PANEL WITH GFR: Potassium: 4 mmol/L (ref 3.5–5.3)

## 2020-10-01 MED ORDER — LEVOTHYROXINE SODIUM 50 MCG PO TABS: 50.0000 ug | ORAL_TABLET | Freq: Every day | ORAL | 3 refills | Status: DC

## 2020-10-01 NOTE — Addendum Note (Signed)
Addended by: Monica Becton on: 10/01/2020 12:08 PM   Modules accepted: Orders

## 2020-10-27 ENCOUNTER — Other Ambulatory Visit: Payer: Self-pay | Admitting: Sports Medicine

## 2020-10-27 DIAGNOSIS — F411 Generalized anxiety disorder: Secondary | ICD-10-CM

## 2020-11-22 ENCOUNTER — Other Ambulatory Visit: Payer: Self-pay | Admitting: Sports Medicine

## 2020-11-22 DIAGNOSIS — F411 Generalized anxiety disorder: Secondary | ICD-10-CM

## 2020-12-17 ENCOUNTER — Other Ambulatory Visit: Payer: Self-pay | Admitting: Sports Medicine

## 2020-12-17 DIAGNOSIS — F411 Generalized anxiety disorder: Secondary | ICD-10-CM

## 2020-12-17 NOTE — Telephone Encounter (Signed)
Last written 11/23/2020 #20, no refills Last visit 09/21/2020

## 2021-01-04 ENCOUNTER — Other Ambulatory Visit: Payer: Self-pay | Admitting: Sports Medicine

## 2021-01-05 LAB — COMPREHENSIVE METABOLIC PANEL
AG Ratio: 2.2 (calc) (ref 1.0–2.5)
ALT: 22 U/L (ref 9–46)
AST: 19 U/L (ref 10–40)
Albumin: 4.7 g/dL (ref 3.6–5.1)
Alkaline phosphatase (APISO): 49 U/L (ref 36–130)
BUN: 16 mg/dL (ref 7–25)
CO2: 26 mmol/L (ref 20–32)
Calcium: 9.4 mg/dL (ref 8.6–10.3)
Chloride: 102 mmol/L (ref 98–110)
Creat: 0.86 mg/dL (ref 0.60–1.35)
Globulin: 2.1 g/dL (calc) (ref 1.9–3.7)
Glucose, Bld: 101 mg/dL — ABNORMAL HIGH (ref 65–99)
Potassium: 4.2 mmol/L (ref 3.5–5.3)
Sodium: 137 mmol/L (ref 135–146)
Total Bilirubin: 0.6 mg/dL (ref 0.2–1.2)
Total Protein: 6.8 g/dL (ref 6.1–8.1)

## 2021-01-05 LAB — LIPID PANEL
Cholesterol: 212 mg/dL — ABNORMAL HIGH (ref <200)
HDL: 64 mg/dL (ref >=40)
LDL Cholesterol (Calc): 121 mg/dL (calc) — ABNORMAL HIGH
Non-HDL Cholesterol (Calc): 148 mg/dL (calc) — ABNORMAL HIGH (ref <130)
Total CHOL/HDL Ratio: 3.3 (calc) (ref <5.0)
Triglycerides: 152 mg/dL — ABNORMAL HIGH (ref <150)

## 2021-01-05 LAB — T3, FREE: T3, Free: 3.1 pg/mL (ref 2.3–4.2)

## 2021-01-05 LAB — T4, FREE: Free T4: 1 ng/dL (ref 0.8–1.8)

## 2021-01-05 MED ORDER — ROSUVASTATIN CALCIUM 40 MG PO TABS: 40.0000 mg | ORAL_TABLET | Freq: Every day | ORAL | 3 refills | Status: DC

## 2021-01-05 NOTE — Addendum Note (Signed)
Addended by: Monica Becton on: 01/05/2021 04:04 PM   Modules accepted: Orders

## 2021-01-10 ENCOUNTER — Other Ambulatory Visit: Payer: Self-pay | Admitting: Sports Medicine

## 2021-01-10 DIAGNOSIS — F411 Generalized anxiety disorder: Secondary | ICD-10-CM

## 2021-02-06 ENCOUNTER — Other Ambulatory Visit: Payer: Self-pay | Admitting: Sports Medicine

## 2021-02-06 DIAGNOSIS — F411 Generalized anxiety disorder: Secondary | ICD-10-CM

## 2021-03-07 ENCOUNTER — Other Ambulatory Visit: Payer: Self-pay | Admitting: Sports Medicine

## 2021-03-07 DIAGNOSIS — F411 Generalized anxiety disorder: Secondary | ICD-10-CM

## 2021-04-06 ENCOUNTER — Other Ambulatory Visit: Payer: Self-pay | Admitting: Sports Medicine

## 2021-04-06 DIAGNOSIS — F411 Generalized anxiety disorder: Secondary | ICD-10-CM

## 2021-05-09 ENCOUNTER — Other Ambulatory Visit: Payer: Self-pay | Admitting: Sports Medicine

## 2021-05-09 DIAGNOSIS — F411 Generalized anxiety disorder: Secondary | ICD-10-CM

## 2021-05-26 ENCOUNTER — Other Ambulatory Visit: Payer: Self-pay | Admitting: Sports Medicine

## 2021-05-26 DIAGNOSIS — F411 Generalized anxiety disorder: Secondary | ICD-10-CM

## 2021-06-09 ENCOUNTER — Other Ambulatory Visit: Payer: Self-pay | Admitting: Sports Medicine

## 2021-06-09 DIAGNOSIS — F411 Generalized anxiety disorder: Secondary | ICD-10-CM

## 2021-07-06 ENCOUNTER — Other Ambulatory Visit: Payer: Self-pay | Admitting: Sports Medicine

## 2021-07-06 DIAGNOSIS — F411 Generalized anxiety disorder: Secondary | ICD-10-CM

## 2021-08-01 ENCOUNTER — Other Ambulatory Visit: Payer: Self-pay | Admitting: Sports Medicine

## 2021-08-01 DIAGNOSIS — F411 Generalized anxiety disorder: Secondary | ICD-10-CM

## 2021-09-05 ENCOUNTER — Other Ambulatory Visit: Payer: Self-pay | Admitting: Sports Medicine

## 2021-09-05 DIAGNOSIS — F411 Generalized anxiety disorder: Secondary | ICD-10-CM

## 2021-09-09 ENCOUNTER — Other Ambulatory Visit: Payer: Self-pay | Admitting: Sports Medicine

## 2021-09-09 DIAGNOSIS — F411 Generalized anxiety disorder: Secondary | ICD-10-CM

## 2021-09-22 ENCOUNTER — Telehealth: Payer: Self-pay

## 2021-09-22 DIAGNOSIS — E782 Mixed hyperlipidemia: Secondary | ICD-10-CM

## 2021-09-22 NOTE — Telephone Encounter (Signed)
Patient called and would like lab orders placed. He is scheduled for physical next week but would like to come in for blood work tomorrow.

## 2021-09-23 NOTE — Telephone Encounter (Signed)
Absolutely, labs ordered, they should be fasting.

## 2021-09-27 ENCOUNTER — Other Ambulatory Visit: Payer: Self-pay

## 2021-09-27 ENCOUNTER — Ambulatory Visit (INDEPENDENT_AMBULATORY_CARE_PROVIDER_SITE_OTHER): Payer: BLUE CROSS/BLUE SHIELD | Admitting: Sports Medicine

## 2021-09-27 VITALS — BP 130/86 | HR 73 | Ht 72.0 in | Wt 214.0 lb

## 2021-09-27 DIAGNOSIS — F411 Generalized anxiety disorder: Secondary | ICD-10-CM | POA: Diagnosis not present

## 2021-09-27 DIAGNOSIS — Z0000 Encounter for general adult medical examination without abnormal findings: Secondary | ICD-10-CM | POA: Diagnosis not present

## 2021-09-27 DIAGNOSIS — E782 Mixed hyperlipidemia: Secondary | ICD-10-CM | POA: Diagnosis not present

## 2021-09-27 MED ORDER — ALPRAZOLAM 0.5 MG PO TABS: 0.5000 mg | ORAL_TABLET | Freq: Every day | ORAL | 3 refills | Status: DC | PRN

## 2021-09-27 NOTE — Assessment & Plan Note (Signed)
Anxiety is under adequate control on current dose of Zoloft 100 mg daily. Refilling alprazolam, only uses this about once a day at the most.

## 2021-09-27 NOTE — Assessment & Plan Note (Signed)
Annual physical as above, declines flu shot. Checking labs.

## 2021-09-27 NOTE — Progress Notes (Addendum)
  Subjective:    CC: Annual Physical Exam  HPI:  This patient is here for their annual physical  I reviewed the past medical history, family history, social history, surgical history, and allergies today and no changes were needed.  Please see the problem list section below in epic for further details.  Past Medical History: Past Medical History:  Diagnosis Date   Anxiety and depression    Past Surgical History: Past Surgical History:  Procedure Laterality Date   NO PAST SURGERIES     Social History: Social History   Socioeconomic History   Marital status: Single    Spouse name: Not on file   Number of children: 2   Years of education: 12   Highest education level: Not on file  Occupational History   Occupation: Bartender  Tobacco Use   Smoking status: Never   Smokeless tobacco: Never  Substance and Sexual Activity   Alcohol use: Yes   Drug use: No   Sexual activity: Yes    Birth control/protection: None  Other Topics Concern   Not on file  Social History Narrative   Not on file   Social Determinants of Health   Financial Resource Strain: Not on file  Food Insecurity: Not on file  Transportation Needs: Not on file  Physical Activity: Not on file  Stress: Not on file  Social Connections: Not on file   Family History: Family History  Problem Relation Age of Onset   Depression Father    Allergies: No Known Allergies Medications: See med rec.  Review of Systems: No headache, visual changes, nausea, vomiting, diarrhea, constipation, dizziness, abdominal pain, skin rash, fevers, chills, night sweats, swollen lymph nodes, weight loss, chest pain, body aches, joint swelling, muscle aches, shortness of breath, mood changes, visual or auditory hallucinations.  Objective:    General: Well Developed, well nourished, and in no acute distress.  Neuro: Alert and oriented x3, extra-ocular muscles intact, sensation grossly intact. Cranial nerves II through XII are  intact, motor, sensory, and coordinative functions are all intact. HEENT: Normocephalic, atraumatic, pupils equal round reactive to light, neck supple, no masses, no lymphadenopathy, thyroid nonpalpable. Oropharynx, nasopharynx, external ear canals are unremarkable. Skin: Warm and dry, no rashes noted.  Cardiac: Regular rate and rhythm, no murmurs rubs or gallops.  Respiratory: Clear to auscultation bilaterally. Not using accessory muscles, speaking in full sentences.  Abdominal: Soft, nontender, nondistended, positive bowel sounds, no masses, no organomegaly.  Musculoskeletal: Shoulder, elbow, wrist, hip, knee, ankle stable, and with full range of motion.  Impression and Recommendations:    The patient was counselled, risk factors were discussed, anticipatory guidance given.  Annual physical exam Annual physical as above, declines flu shot. Checking labs.  Anxiety, generalized Anxiety is under adequate control on current dose of Zoloft 100 mg daily. Refilling alprazolam, only uses this about once a day at the most.  Mixed hyperlipidemia Consistent with rosuvastatin, 40 mg daily. LDL is okay, triglycerides still significantly elevated, adding Vascepa for diagnosis hypertriglyceridemia.   ___________________________________________ Ihor Austin. Benjamin Stain, M.D., ABFM., CAQSM. Primary Care and Sports Medicine [place] [facility]  Adjunct Professor of Family Medicine  [employer] [facility] [employer]

## 2021-09-28 LAB — LIPID PANEL
Cholesterol: 248 mg/dL — ABNORMAL HIGH (ref <200)
HDL: 67 mg/dL (ref >=40)
LDL Cholesterol (Calc): 129 mg/dL (calc) — ABNORMAL HIGH
Non-HDL Cholesterol (Calc): 181 mg/dL (calc) — ABNORMAL HIGH (ref <130)
Total CHOL/HDL Ratio: 3.7 (calc) (ref <5.0)
Triglycerides: 362 mg/dL — ABNORMAL HIGH (ref <150)

## 2021-09-28 LAB — CBC
HCT: 45.4 % (ref 38.5–50.0)
Hemoglobin: 15.1 g/dL (ref 13.2–17.1)
MCH: 30.1 pg (ref 27.0–33.0)
MCHC: 33.3 g/dL (ref 32.0–36.0)
MCV: 90.4 fL (ref 80.0–100.0)
MPV: 12.4 fL (ref 7.5–12.5)
Platelets: 251 10*3/uL (ref 140–400)
RBC: 5.02 10*6/uL (ref 4.20–5.80)
RDW: 11.7 % (ref 11.0–15.0)
WBC: 5.2 10*3/uL (ref 3.8–10.8)

## 2021-09-28 LAB — COMPREHENSIVE METABOLIC PANEL
AG Ratio: 2 (calc) (ref 1.0–2.5)
ALT: 21 U/L (ref 9–46)
AST: 19 U/L (ref 10–40)
Albumin: 4.9 g/dL (ref 3.6–5.1)
Alkaline phosphatase (APISO): 53 U/L (ref 36–130)
BUN: 17 mg/dL (ref 7–25)
CO2: 27 mmol/L (ref 20–32)
Calcium: 9.8 mg/dL (ref 8.6–10.3)
Chloride: 101 mmol/L (ref 98–110)
Creat: 0.87 mg/dL (ref 0.60–1.29)
Globulin: 2.4 g/dL (calc) (ref 1.9–3.7)
Glucose, Bld: 107 mg/dL — ABNORMAL HIGH (ref 65–99)
Potassium: 4.5 mmol/L (ref 3.5–5.3)
Sodium: 137 mmol/L (ref 135–146)
Total Bilirubin: 0.8 mg/dL (ref 0.2–1.2)
Total Protein: 7.3 g/dL (ref 6.1–8.1)

## 2021-09-28 LAB — T4, FREE: Free T4: 1.1 ng/dL (ref 0.8–1.8)

## 2021-09-28 LAB — TSH: TSH: 1.96 mIU/L (ref 0.40–4.50)

## 2021-09-28 LAB — T3, FREE: T3, Free: 3.8 pg/mL (ref 2.3–4.2)

## 2021-09-28 MED ORDER — ICOSAPENT ETHYL 1 G PO CAPS: 2.0000 g | ORAL_CAPSULE | Freq: Two times a day (BID) | ORAL | 11 refills | Status: DC

## 2021-09-28 NOTE — Addendum Note (Signed)
Addended by: Monica Becton on: 09/28/2021 03:44 PM   Modules accepted: Orders

## 2021-09-28 NOTE — Assessment & Plan Note (Signed)
Consistent with rosuvastatin, 40 mg daily. LDL is okay, triglycerides still significantly elevated, adding Vascepa for diagnosis hypertriglyceridemia.

## 2021-10-03 ENCOUNTER — Other Ambulatory Visit: Payer: Self-pay | Admitting: Sports Medicine

## 2021-10-03 ENCOUNTER — Telehealth: Payer: Self-pay

## 2021-10-03 DIAGNOSIS — E038 Other specified hypothyroidism: Secondary | ICD-10-CM

## 2021-10-03 DIAGNOSIS — E782 Mixed hyperlipidemia: Secondary | ICD-10-CM

## 2021-10-03 NOTE — Telephone Encounter (Signed)
Patient called to see if there is a generic for Vascepa. Even with insurance it is over $200 per month. Please advise.

## 2021-10-04 MED ORDER — OMEGA-3-ACID ETHYL ESTERS 1 G PO CAPS: 2.0000 g | ORAL_CAPSULE | Freq: Two times a day (BID) | ORAL | 3 refills | Status: DC

## 2021-10-04 NOTE — Telephone Encounter (Signed)
We can try Lovaza instead, sending in now.

## 2021-10-05 NOTE — Telephone Encounter (Signed)
Called [facility], the insurance gave no reason why they would not cover lovaza. Also, the vascepa did not call for a PA but could possibly have one done to get it covered at a lower rate.   I attempted to call patient's insurance company but their phones were down and their system was down and they could not help me.   Please try to do a PA for vascepa to help this man get his cholesterol under control.

## 2021-10-26 ENCOUNTER — Other Ambulatory Visit: Payer: Self-pay | Admitting: Sports Medicine

## 2021-10-26 DIAGNOSIS — E782 Mixed hyperlipidemia: Secondary | ICD-10-CM

## 2021-11-18 ENCOUNTER — Encounter: Payer: Self-pay | Admitting: Sports Medicine

## 2021-11-18 ENCOUNTER — Other Ambulatory Visit: Payer: Self-pay | Admitting: Sports Medicine

## 2021-11-18 DIAGNOSIS — F411 Generalized anxiety disorder: Secondary | ICD-10-CM

## 2021-11-18 DIAGNOSIS — E782 Mixed hyperlipidemia: Secondary | ICD-10-CM

## 2021-11-18 MED ORDER — ROSUVASTATIN CALCIUM 40 MG PO TABS: 40.0000 mg | ORAL_TABLET | Freq: Every day | ORAL | 3 refills | Status: DC

## 2021-11-18 MED ORDER — ALPRAZOLAM 1 MG PO TABS: 1.0000 mg | ORAL_TABLET | Freq: Every day | ORAL | 3 refills | Status: DC | PRN

## 2021-12-05 ENCOUNTER — Other Ambulatory Visit: Payer: Self-pay | Admitting: Sports Medicine

## 2021-12-05 DIAGNOSIS — F411 Generalized anxiety disorder: Secondary | ICD-10-CM

## 2022-01-29 ENCOUNTER — Other Ambulatory Visit: Payer: Self-pay | Admitting: Sports Medicine

## 2022-01-29 DIAGNOSIS — F411 Generalized anxiety disorder: Secondary | ICD-10-CM

## 2022-04-02 ENCOUNTER — Other Ambulatory Visit: Payer: Self-pay | Admitting: Sports Medicine

## 2022-04-02 DIAGNOSIS — F411 Generalized anxiety disorder: Secondary | ICD-10-CM

## 2022-06-07 ENCOUNTER — Other Ambulatory Visit: Payer: Self-pay | Admitting: Sports Medicine

## 2022-06-07 DIAGNOSIS — F411 Generalized anxiety disorder: Secondary | ICD-10-CM

## 2022-06-16 ENCOUNTER — Other Ambulatory Visit: Payer: Self-pay | Admitting: Sports Medicine

## 2022-06-16 DIAGNOSIS — F411 Generalized anxiety disorder: Secondary | ICD-10-CM

## 2022-08-10 ENCOUNTER — Other Ambulatory Visit: Payer: Self-pay | Admitting: Sports Medicine

## 2022-08-10 DIAGNOSIS — F411 Generalized anxiety disorder: Secondary | ICD-10-CM

## 2022-10-19 ENCOUNTER — Other Ambulatory Visit: Payer: Self-pay | Admitting: Sports Medicine

## 2022-10-19 DIAGNOSIS — F411 Generalized anxiety disorder: Secondary | ICD-10-CM

## 2022-11-07 ENCOUNTER — Telehealth: Payer: Self-pay

## 2022-11-07 NOTE — Telephone Encounter (Signed)
Error

## 2022-11-21 ENCOUNTER — Ambulatory Visit: Payer: BLUE CROSS/BLUE SHIELD | Admitting: Sports Medicine

## 2022-12-09 ENCOUNTER — Other Ambulatory Visit: Payer: Self-pay | Admitting: Sports Medicine

## 2022-12-09 DIAGNOSIS — E782 Mixed hyperlipidemia: Secondary | ICD-10-CM

## 2022-12-09 DIAGNOSIS — F411 Generalized anxiety disorder: Secondary | ICD-10-CM

## 2022-12-11 ENCOUNTER — Other Ambulatory Visit: Payer: Self-pay | Admitting: Sports Medicine

## 2022-12-11 DIAGNOSIS — F411 Generalized anxiety disorder: Secondary | ICD-10-CM

## 2022-12-12 ENCOUNTER — Ambulatory Visit (INDEPENDENT_AMBULATORY_CARE_PROVIDER_SITE_OTHER): Payer: BLUE CROSS/BLUE SHIELD | Admitting: Sports Medicine

## 2022-12-12 ENCOUNTER — Encounter: Payer: Self-pay | Admitting: Sports Medicine

## 2022-12-12 VITALS — BP 140/91 | HR 95

## 2022-12-12 DIAGNOSIS — F411 Generalized anxiety disorder: Secondary | ICD-10-CM

## 2022-12-12 DIAGNOSIS — Z0000 Encounter for general adult medical examination without abnormal findings: Secondary | ICD-10-CM | POA: Diagnosis not present

## 2022-12-12 DIAGNOSIS — E291 Testicular hypofunction: Secondary | ICD-10-CM | POA: Diagnosis not present

## 2022-12-12 DIAGNOSIS — E782 Mixed hyperlipidemia: Secondary | ICD-10-CM

## 2022-12-12 MED ORDER — ALPRAZOLAM 1 MG PO TABS: 1.0000 mg | ORAL_TABLET | Freq: Every day | ORAL | 5 refills | Status: DC | PRN

## 2022-12-12 NOTE — Assessment & Plan Note (Signed)
Continues with Zoloft and alprazolam, refilling alprazolam, he uses it about once daily. He is getting sufficient exercise.

## 2022-12-12 NOTE — Progress Notes (Signed)
  Subjective:    CC: Annual Physical Exam  HPI:  This patient is here for their annual physical  I reviewed the past medical history, family history, social history, surgical history, and allergies today and no changes were needed.  Please see the problem list section below in epic for further details.  Past Medical History: Past Medical History:  Diagnosis Date   Anxiety and depression    Past Surgical History: Past Surgical History:  Procedure Laterality Date   NO PAST SURGERIES     Social History: Social History   Socioeconomic History   Marital status: Single    Spouse name: Not on file   Number of children: 2   Years of education: 12   Highest education level: Not on file  Occupational History   Occupation: Bartender  Tobacco Use   Smoking status: Never   Smokeless tobacco: Never  Substance and Sexual Activity   Alcohol use: Yes   Drug use: No   Sexual activity: Yes    Birth control/protection: None  Other Topics Concern   Not on file  Social History Narrative   Not on file   Social Determinants of Health   Financial Resource Strain: Not on file  Food Insecurity: Not on file  Transportation Needs: Not on file  Physical Activity: Not on file  Stress: Not on file  Social Connections: Not on file   Family History: Family History  Problem Relation Age of Onset   Depression Father    Allergies: No Known Allergies Medications: See med rec.  Review of Systems: No headache, visual changes, nausea, vomiting, diarrhea, constipation, dizziness, abdominal pain, skin rash, fevers, chills, night sweats, swollen lymph nodes, weight loss, chest pain, body aches, joint swelling, muscle aches, shortness of breath, mood changes, visual or auditory hallucinations.  Objective:    General: Well Developed, well nourished, and in no acute distress.  Neuro: Alert and oriented x3, extra-ocular muscles intact, sensation grossly intact. Cranial nerves II through XII are  intact, motor, sensory, and coordinative functions are all intact. HEENT: Normocephalic, atraumatic, pupils equal round reactive to light, neck supple, no masses, no lymphadenopathy, thyroid nonpalpable. Oropharynx, nasopharynx, external ear canals are unremarkable. Skin: Warm and dry, no rashes noted.  Cardiac: Regular rate and rhythm, no murmurs rubs or gallops.  Respiratory: Clear to auscultation bilaterally. Not using accessory muscles, speaking in full sentences.  Abdominal: Soft, nontender, nondistended, positive bowel sounds, no masses, no organomegaly.  Musculoskeletal: Shoulder, elbow, wrist, hip, knee, ankle stable, and with full range of motion.  Impression and Recommendations:    The patient was counselled, risk factors were discussed, anticipatory guidance given.  Annual physical exam Annual physical as above. He does feel somewhat fatigued and is wondering if his testosterone may be low, we will check it today. Declines flu shot. Return to see me in a year for fasting annual physical.  Anxiety, generalized Continues with Zoloft and alprazolam, refilling alprazolam, he uses it about once daily. He is getting sufficient exercise.  ____________________________________________ Gwen Her. Dianah Field, M.D., ABFM., CAQSM., AME. Primary Care and Sports Medicine [facility]  Adjunct Professor of [employer] [facility] [employer]  Risk manager

## 2022-12-12 NOTE — Assessment & Plan Note (Signed)
Annual physical as above. He does feel somewhat fatigued and is wondering if his testosterone may be low, we will check it today. Declines flu shot. Return to see me in a year for fasting annual physical.

## 2022-12-15 LAB — COMPLETE METABOLIC PANEL WITH GFR
AG Ratio: 2.4 (calc) (ref 1.0–2.5)
ALT: 22 U/L (ref 9–46)
AST: 18 U/L (ref 10–40)
Albumin: 5 g/dL (ref 3.6–5.1)
Alkaline phosphatase (APISO): 51 U/L (ref 36–130)
BUN: 15 mg/dL (ref 7–25)
CO2: 28 mmol/L (ref 20–32)
Calcium: 9.8 mg/dL (ref 8.6–10.3)
Chloride: 100 mmol/L (ref 98–110)
Creat: 0.92 mg/dL (ref 0.60–1.29)
Globulin: 2.1 g/dL (calc) (ref 1.9–3.7)
Glucose, Bld: 98 mg/dL (ref 65–99)
Potassium: 4.1 mmol/L (ref 3.5–5.3)
Sodium: 138 mmol/L (ref 135–146)
Total Bilirubin: 0.7 mg/dL (ref 0.2–1.2)
Total Protein: 7.1 g/dL (ref 6.1–8.1)
eGFR: 105 mL/min/{1.73_m2} (ref >=60)

## 2022-12-15 LAB — CBC
HCT: 44.7 % (ref 38.5–50.0)
Hemoglobin: 15.1 g/dL (ref 13.2–17.1)
MCH: 30.1 pg (ref 27.0–33.0)
MCHC: 33.8 g/dL (ref 32.0–36.0)
MCV: 89.2 fL (ref 80.0–100.0)
MPV: 12 fL (ref 7.5–12.5)
Platelets: 239 10*3/uL (ref 140–400)
RBC: 5.01 10*6/uL (ref 4.20–5.80)
RDW: 11.8 % (ref 11.0–15.0)
WBC: 4.8 10*3/uL (ref 3.8–10.8)

## 2022-12-15 LAB — PSA, TOTAL AND FREE
PSA, % Free: 33 % (calc) (ref >25)
PSA, Free: 0.1 ng/mL
PSA, Total: 0.3 ng/mL (ref <=4.0)

## 2022-12-15 LAB — TESTOSTERONE, FREE & TOTAL
Free Testosterone: 45.8 pg/mL (ref 35.0–155.0)
Testosterone, Total, LC-MS-MS: 307 ng/dL (ref 250–1100)

## 2022-12-15 LAB — LIPID PANEL
Cholesterol: 160 mg/dL (ref <200)
HDL: 70 mg/dL (ref >=40)
LDL Cholesterol (Calc): 63 mg/dL (calc)
Non-HDL Cholesterol (Calc): 90 mg/dL (calc) (ref <130)
Total CHOL/HDL Ratio: 2.3 (calc) (ref <5.0)
Triglycerides: 199 mg/dL — ABNORMAL HIGH (ref <150)

## 2022-12-15 LAB — HEMOGLOBIN A1C
Hgb A1c MFr Bld: 5.6 % of total Hgb (ref <5.7)
Mean Plasma Glucose: 114 mg/dL
eAG (mmol/L): 6.3 mmol/L

## 2022-12-15 LAB — TSH: TSH: 3.61 mIU/L (ref 0.40–4.50)

## 2023-01-10 ENCOUNTER — Other Ambulatory Visit: Payer: Self-pay | Admitting: Sports Medicine

## 2023-01-10 DIAGNOSIS — E782 Mixed hyperlipidemia: Secondary | ICD-10-CM

## 2023-02-28 ENCOUNTER — Encounter (INDEPENDENT_AMBULATORY_CARE_PROVIDER_SITE_OTHER): Payer: BLUE CROSS/BLUE SHIELD | Admitting: Sports Medicine

## 2023-02-28 DIAGNOSIS — F411 Generalized anxiety disorder: Secondary | ICD-10-CM

## 2023-03-01 MED ORDER — SERTRALINE HCL 25 MG PO TABS: ORAL_TABLET | ORAL | 0 refills | Status: DC

## 2023-03-01 MED ORDER — ESCITALOPRAM OXALATE 10 MG PO TABS: ORAL_TABLET | ORAL | 11 refills | Status: DC

## 2023-03-01 NOTE — Telephone Encounter (Signed)
I spent 5 total minutes of online digital evaluation and management services in this patient-initiated request for online care. 

## 2023-03-24 ENCOUNTER — Other Ambulatory Visit: Payer: Self-pay | Admitting: Sports Medicine

## 2023-03-24 DIAGNOSIS — F411 Generalized anxiety disorder: Secondary | ICD-10-CM

## 2023-06-05 ENCOUNTER — Other Ambulatory Visit: Payer: Self-pay | Admitting: Sports Medicine

## 2023-06-05 DIAGNOSIS — F411 Generalized anxiety disorder: Secondary | ICD-10-CM

## 2023-10-08 ENCOUNTER — Other Ambulatory Visit: Payer: Self-pay | Admitting: Sports Medicine

## 2023-10-08 DIAGNOSIS — F411 Generalized anxiety disorder: Secondary | ICD-10-CM

## 2024-01-04 ENCOUNTER — Other Ambulatory Visit: Payer: Self-pay | Admitting: Sports Medicine

## 2024-01-04 DIAGNOSIS — E782 Mixed hyperlipidemia: Secondary | ICD-10-CM

## 2024-02-13 ENCOUNTER — Ambulatory Visit (INDEPENDENT_AMBULATORY_CARE_PROVIDER_SITE_OTHER): Payer: BLUE CROSS/BLUE SHIELD | Admitting: Sports Medicine

## 2024-02-13 ENCOUNTER — Encounter: Payer: Self-pay | Admitting: Sports Medicine

## 2024-02-13 VITALS — BP 113/72 | HR 72 | Temp 98.8°F | Resp 20 | Ht 72.0 in | Wt 222.1 lb

## 2024-02-13 DIAGNOSIS — E66811 Obesity, class 1: Secondary | ICD-10-CM | POA: Diagnosis not present

## 2024-02-13 DIAGNOSIS — F411 Generalized anxiety disorder: Secondary | ICD-10-CM | POA: Diagnosis not present

## 2024-02-13 DIAGNOSIS — Z6830 Body mass index (BMI) 30.0-30.9, adult: Secondary | ICD-10-CM

## 2024-02-13 DIAGNOSIS — Z0000 Encounter for general adult medical examination without abnormal findings: Secondary | ICD-10-CM | POA: Diagnosis not present

## 2024-02-13 DIAGNOSIS — Z1211 Encounter for screening for malignant neoplasm of colon: Secondary | ICD-10-CM | POA: Diagnosis not present

## 2024-02-13 DIAGNOSIS — E782 Mixed hyperlipidemia: Secondary | ICD-10-CM

## 2024-02-13 DIAGNOSIS — G479 Sleep disorder, unspecified: Secondary | ICD-10-CM | POA: Diagnosis not present

## 2024-02-13 MED ORDER — TRAZODONE HCL 50 MG PO TABS: ORAL_TABLET | ORAL | 3 refills | Status: DC

## 2024-02-13 MED ORDER — ESCITALOPRAM OXALATE 10 MG PO TABS: 10.0000 mg | ORAL_TABLET | Freq: Every day | ORAL | 3 refills | Status: DC

## 2024-02-13 MED ORDER — ALPRAZOLAM 1 MG PO TABS: 1.0000 mg | ORAL_TABLET | Freq: Every day | ORAL | 3 refills | Status: DC | PRN

## 2024-02-13 NOTE — Assessment & Plan Note (Signed)
 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 He does have significant difficulty sleeping after that, he will often look at a bright screens such as his phone or the TV, he will often drink couple of beers and then take a Xanax to help him sleep. We discussed sleep hygiene today including the avoidance of alcohol before bed, he will stop taking Xanax at night, he will avoid bright screens or at least uses bluelight filter. He will set the stage for drowsiness by creating a dark room that is quiet. I am also going to add a bit of trazodone which will be healthier long-term to help him sleep and clear his mind as opposed to Xanax which can be habit-forming. I would like to touch base again via MyChart or get a visit in about a month after an up titration of trazodone.

## 2024-02-13 NOTE — Assessment & Plan Note (Signed)
 Fasting annual physical as above, ordering Cologuard, declines flu shot.

## 2024-02-13 NOTE — Assessment & Plan Note (Signed)
 Tyler Murray does okay with Zoloft and alprazolam, he does need a refill his Xanax. He has been using his Xanax occasionally to help him sleep. I have advised against this. See below for insomnia.

## 2024-02-13 NOTE — Assessment & Plan Note (Signed)
 Tyler Murray does have obesity. He has been doing fairly well in the gym. We did discuss an exercise prescription, getting heart rate up to 150 and holding it there for 30 minutes 5 times a week, he will switch his weightlifting to circuit training to keep his heart rate up. He will also continue to cut back on carbohydrates, and if all of this fails we may consider a GLP-1.

## 2024-02-13 NOTE — Progress Notes (Signed)
 Subjective:    CC: Annual Physical Exam  HPI:  This patient is here for their annual physical  I reviewed the past medical history, family history, social history, surgical history, and allergies today and no changes were needed.  Please see the problem list section below in epic for further details.  Past Medical History: Past Medical History:  Diagnosis Date   Anxiety and depression    Past Surgical History: Past Surgical History:  Procedure Laterality Date   NO PAST SURGERIES     Social History: Social History   Socioeconomic History   Marital status: Single    Spouse name: Not on file   Number of children: 2   Years of education: 12   Highest education level: Not on file  Occupational History   Occupation: Bartender  Tobacco Use   Smoking status: Never   Smokeless tobacco: Never  Substance and Sexual Activity   Alcohol use: Yes   Drug use: No   Sexual activity: Yes    Birth control/protection: None  Other Topics Concern   Not on file  Social History Narrative   Not on file   Social Drivers of Health   Financial Resource Strain: Not on file  Food Insecurity: Not on file  Transportation Needs: Not on file  Physical Activity: Not on file  Stress: Not on file  Social Connections: Unknown (04/16/2022)   [facility]   Social Network    Social Network: Not on file   Family History: Family History  Problem Relation Age of Onset   Depression Father    Allergies: No Known Allergies Medications: See med rec.  Review of Systems: No headache, visual changes, nausea, vomiting, diarrhea, constipation, dizziness, abdominal pain, skin rash, fevers, chills, night sweats, swollen lymph nodes, weight loss, chest pain, body aches, joint swelling, muscle aches, shortness of breath, mood changes, visual or auditory hallucinations.  Objective:    General: Well Developed, well nourished, and in no acute distress.  Neuro: Alert and  oriented x3, extra-ocular muscles intact, sensation grossly intact. Cranial nerves II through XII are intact, motor, sensory, and coordinative functions are all intact. HEENT: Normocephalic, atraumatic, pupils equal round reactive to light, neck supple, no masses, no lymphadenopathy, thyroid nonpalpable. Oropharynx, nasopharynx, external ear canals are unremarkable. Skin: Warm and dry, no rashes noted.  Cardiac: Regular rate and rhythm, no murmurs rubs or gallops.  Respiratory: Clear to auscultation bilaterally. Not using accessory muscles, speaking in full sentences.  Abdominal: Soft, nontender, nondistended, positive bowel sounds, no masses, no organomegaly.  Musculoskeletal: Shoulder, elbow, wrist, hip, knee, ankle stable, and with full range of motion.  Impression and Recommendations:    The patient was counselled, risk factors were discussed, anticipatory guidance given.  Annual physical exam Fasting annual physical as above, ordering Cologuard, declines flu shot.  Anxiety, generalized Glendon does okay with Zoloft and alprazolam, he does need a refill his Xanax. He has been using his Xanax occasionally to help him sleep. I have advised against this. See below for insomnia.  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 He does have significant difficulty sleeping after that, he will often look at a bright screens such as his phone or the TV, he will often drink couple of beers and then take a Xanax to help him sleep. We discussed sleep hygiene today including the avoidance of  alcohol before bed, he will stop taking Xanax at night, he will avoid bright screens or at least uses bluelight filter. He will set the stage for drowsiness by creating a dark room that is quiet. I am also going to add a bit of trazodone which will be healthier long-term to help him sleep and clear his  mind as opposed to Xanax which can be habit-forming. I would like to touch base again via MyChart or get a visit in about a month after an up titration of trazodone.  Obesity (BMI 30.0-34.9) Wyndell does have obesity. He has been doing fairly well in the gym. We did discuss an exercise prescription, getting heart rate up to 150 and holding it there for 30 minutes 5 times a week, he will switch his weightlifting to circuit training to keep his heart rate up. He will also continue to cut back on carbohydrates, and if all of this fails we may consider a GLP-1.   ____________________________________________ Ihor Austin. Benjamin Stain, M.D., ABFM., CAQSM., AME. Primary Care and Sports Medicine [facility]  Adjunct Professor of Family Medicine  [employer] [facility] [employer]  Restaurant manager, fast food

## 2024-02-14 LAB — CBC WITH DIFFERENTIAL/PLATELET
Basophils Absolute: 0 10*3/uL (ref 0.0–0.2)
Basos: 1 %
EOS (ABSOLUTE): 0.2 10*3/uL (ref 0.0–0.4)
Eos: 3 %
Hematocrit: 45.5 % (ref 37.5–51.0)
Hemoglobin: 14.8 g/dL (ref 13.0–17.7)
Immature Grans (Abs): 0 10*3/uL (ref 0.0–0.1)
Immature Granulocytes: 0 %
Lymphocytes Absolute: 1.6 10*3/uL (ref 0.7–3.1)
Lymphs: 31 %
MCH: 29.5 pg (ref 26.6–33.0)
MCHC: 32.5 g/dL (ref 31.5–35.7)
MCV: 91 fL (ref 79–97)
Monocytes Absolute: 0.6 10*3/uL (ref 0.1–0.9)
Monocytes: 12 %
Neutrophils Absolute: 2.6 10*3/uL (ref 1.4–7.0)
Neutrophils: 53 %
Platelets: 221 10*3/uL (ref 150–450)
RBC: 5.01 x10E6/uL (ref 4.14–5.80)
RDW: 11.9 % (ref 11.6–15.4)
WBC: 4.9 10*3/uL (ref 3.4–10.8)

## 2024-02-14 LAB — HEMOGLOBIN A1C
Est. average glucose Bld gHb Est-mCnc: 111 mg/dL
Hgb A1c MFr Bld: 5.5 % (ref 4.8–5.6)

## 2024-02-14 LAB — LIPID PANEL
Chol/HDL Ratio: 2.9 ratio (ref 0.0–5.0)
Cholesterol, Total: 152 mg/dL (ref 100–199)
HDL: 53 mg/dL (ref >39)
LDL Chol Calc (NIH): 71 mg/dL (ref 0–99)
Triglycerides: 168 mg/dL — ABNORMAL HIGH (ref 0–149)
VLDL Cholesterol Cal: 28 mg/dL (ref 5–40)

## 2024-02-14 LAB — CMP14+EGFR
ALT: 23 IU/L (ref 0–44)
AST: 21 IU/L (ref 0–40)
Albumin: 5 g/dL (ref 4.1–5.1)
Alkaline Phosphatase: 52 IU/L (ref 44–121)
BUN/Creatinine Ratio: 18 (ref 9–20)
BUN: 16 mg/dL (ref 6–24)
Bilirubin Total: 0.6 mg/dL (ref 0.0–1.2)
CO2: 22 mmol/L (ref 20–29)
Calcium: 9.7 mg/dL (ref 8.7–10.2)
Chloride: 104 mmol/L (ref 96–106)
Creatinine, Ser: 0.89 mg/dL (ref 0.76–1.27)
Globulin, Total: 2 g/dL (ref 1.5–4.5)
Glucose: 116 mg/dL — ABNORMAL HIGH (ref 70–99)
Potassium: 4.3 mmol/L (ref 3.5–5.2)
Sodium: 140 mmol/L (ref 134–144)
Total Protein: 7 g/dL (ref 6.0–8.5)
eGFR: 106 mL/min/{1.73_m2} (ref >59)

## 2024-02-14 LAB — TSH: TSH: 2.94 u[IU]/mL (ref 0.450–4.500)

## 2024-02-26 LAB — COLOGUARD: COLOGUARD: NEGATIVE

## 2024-03-07 ENCOUNTER — Other Ambulatory Visit: Payer: Self-pay | Admitting: Sports Medicine

## 2024-03-07 DIAGNOSIS — G479 Sleep disorder, unspecified: Secondary | ICD-10-CM

## 2024-03-12 ENCOUNTER — Ambulatory Visit: Admitting: Sports Medicine

## 2024-03-19 ENCOUNTER — Ambulatory Visit (INDEPENDENT_AMBULATORY_CARE_PROVIDER_SITE_OTHER): Admitting: Sports Medicine

## 2024-03-19 ENCOUNTER — Encounter: Payer: Self-pay | Admitting: Sports Medicine

## 2024-03-19 DIAGNOSIS — G479 Sleep disorder, unspecified: Secondary | ICD-10-CM | POA: Diagnosis not present

## 2024-03-19 MED ORDER — TRAZODONE HCL 100 MG PO TABS: 100.0000 mg | ORAL_TABLET | Freq: Every day | ORAL | 3 refills | Status: DC

## 2024-03-19 NOTE — Assessment & Plan Note (Signed)
 Tyler Murray returns, he is an extremely pleasant 48 year old male, we initially saw him with some anxiety and insomnia. 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 We discussed sleep hygiene, avoidance of Xanax and bright screens. He has done really well with lifestyle changes and is sleeping very well. We also added some trazodone, he is currently doing 100 mg nightly which seems to work. Refilling trazodone, he can return to see me as needed for this.

## 2024-03-19 NOTE — Progress Notes (Signed)
    Procedures performed today:    None.  Independent interpretation of notes and tests performed by another provider:   None.  Brief History, Exam, Impression, and Recommendations:    Sleep disorder Maikel returns, he is an extremely pleasant 48 year old male, we initially saw him with some anxiety and insomnia. 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 We discussed sleep hygiene, avoidance of Xanax and bright screens. He has done really well with lifestyle changes and is sleeping very well. We also added some trazodone, he is currently doing 100 mg nightly which seems to work. Refilling trazodone, he can return to see me as needed for this.    ____________________________________________ Ihor Austin. Benjamin Stain, M.D., ABFM., CAQSM., AME. Primary Care and Sports Medicine [facility]  Adjunct Professor of Family Medicine  [employer]  Restaurant manager, fast food

## 2024-06-15 ENCOUNTER — Other Ambulatory Visit: Payer: Self-pay | Admitting: Sports Medicine

## 2024-06-15 DIAGNOSIS — F411 Generalized anxiety disorder: Secondary | ICD-10-CM

## 2024-06-23 ENCOUNTER — Other Ambulatory Visit: Payer: Self-pay | Admitting: Sports Medicine

## 2024-06-23 DIAGNOSIS — G479 Sleep disorder, unspecified: Secondary | ICD-10-CM

## 2024-08-12 ENCOUNTER — Encounter: Payer: Self-pay | Admitting: Sports Medicine

## 2024-10-28 ENCOUNTER — Other Ambulatory Visit: Payer: Self-pay | Admitting: Sports Medicine

## 2024-10-28 DIAGNOSIS — F411 Generalized anxiety disorder: Secondary | ICD-10-CM

## 2024-10-28 NOTE — Telephone Encounter (Unsigned)
 Copied from CRM [id number]. Topic: Clinical - Prescription Issue >> Oct 28, 2024  3:05 PM Wess RAMAN wrote: Reason for CRM: Patient is unable to get ALPRAZolam  (XANAX ) 1 MG tablet. His PCP is no longer at the practice. Advised patient that he would need to do a transfer of care with a  new provider and   Callback #: [id number]  [phone] [place] [place] - [address]  [place] [address] Phone: [phone] Fax: [phone] Hours: Not open 24 hours

## 2024-10-29 MED ORDER — ALPRAZOLAM 1 MG PO TABS: 1.0000 mg | ORAL_TABLET | Freq: Every day | ORAL | 0 refills | Status: DC | PRN

## 2024-10-29 NOTE — Telephone Encounter (Signed)
 Patient informed  He was driving at the time of the call.  He will call back to schedule the [facility] appt with a provider in office.  Informed him that no more refills will be given that he needs to be established and seen  with a   new PCP provider to discuss any refills .

## 2024-10-29 NOTE — Telephone Encounter (Signed)
 Forwarding message to Tyler Murray, [place] covering Dr. Curtis.   Patient requesting rx rf of xanax   Last written07/06/2024 by Dr. Curtis Last OV 03/19/2024 No upcoming appt scheduled.  Not yet had TOC to new provider  Attempted call to assist him in scheduling this - voice mail is full. Could not leave a message .

## 2024-11-13 ENCOUNTER — Ambulatory Visit: Admitting: Urgent Care

## 2024-11-13 ENCOUNTER — Encounter: Payer: Self-pay | Admitting: Urgent Care

## 2024-11-13 DIAGNOSIS — F411 Generalized anxiety disorder: Secondary | ICD-10-CM | POA: Diagnosis not present

## 2024-11-13 DIAGNOSIS — G479 Sleep disorder, unspecified: Secondary | ICD-10-CM

## 2024-11-13 DIAGNOSIS — E782 Mixed hyperlipidemia: Secondary | ICD-10-CM | POA: Diagnosis not present

## 2024-11-13 MED ORDER — ESCITALOPRAM OXALATE 10 MG PO TABS: 10.0000 mg | ORAL_TABLET | Freq: Every day | ORAL | 3 refills | Status: AC

## 2024-11-13 MED ORDER — ALPRAZOLAM 1 MG PO TABS: 1.0000 mg | ORAL_TABLET | Freq: Every day | ORAL | 0 refills | Status: DC | PRN

## 2024-11-13 MED ORDER — BUSPIRONE HCL 10 MG PO TABS: ORAL_TABLET | ORAL | 3 refills | Status: DC

## 2024-11-13 MED ORDER — ROSUVASTATIN CALCIUM 40 MG PO TABS: 40.0000 mg | ORAL_TABLET | Freq: Every day | ORAL | 3 refills | Status: AC

## 2024-11-13 MED ORDER — TRAZODONE HCL 100 MG PO TABS: 100.0000 mg | ORAL_TABLET | Freq: Every day | ORAL | 3 refills | Status: AC

## 2024-11-13 NOTE — Progress Notes (Unsigned)
 Established Patient Office Visit  Subjective:  Patient ID: Tyler Murray, male    DOB: 1976/04/08  Age: 48 y.o. MRN: [id number]  Chief Complaint  Patient presents with   Transfer of care    HPI  Discussed the use of AI scribe software for clinical note transcription with the patient, who gave verbal consent to proceed.  History of Present Illness   Tyler Murray is a 48 year old male with anxiety who presents for medication refills and management of anxiety.  He has a long-standing history of anxiety, described as 'coming in waves' without specific triggers. Symptoms occur sporadically throughout the day and sometimes at night, often leaving him feeling anxious or overwhelmed. He uses alprazolam  sporadically during the day and sometimes at night to manage these symptoms. 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  He has been on Lexapro  since 2019, although there were periods when he was switched to sertraline . He feels more anxious than depressed and describes his anxiety as a 'battle'. Trazodone  is used to help with sleep, which he finds effective in helping him fall and stay asleep.  He has a history of hyperlipidemia and has been taking Crestor  (rosuvastatin ) to manage his cholesterol levels, which were previously elevated. His cholesterol levels have normalized since starting the medication.  He previously took levothyroxine  for hypothyroidism, but has not been on it since 2022. His thyroid function tests have been normal since then.  Anxiety runs in his family. Socially, he works as a patent attorney, leading to a busy and irregular schedule. No specific triggers for anxiety, primarily feels anxious and restless.      Patient Active Problem List   Diagnosis Date Noted   Obesity (BMI 30.0-34.9) 02/13/2024   Mixed hyperlipidemia  09/24/2020   Subclinical hypothyroidism 09/24/2020   Sleep disorder 09/21/2020   Annual physical exam 02/18/2018   Anxiety, generalized 02/18/2018   Past Medical History:  Diagnosis Date   Anxiety and depression    Past Surgical History:  Procedure Laterality Date   NO PAST SURGERIES     Social History   Tobacco Use   Smoking status: Never   Smokeless tobacco: Never  Substance Use Topics   Alcohol use: Yes   Drug use: No      ROS: as noted in HPI  Objective:     BP 120/78   Pulse 71   Ht 6' (1.829 m)   Wt 215 lb (97.5 kg)   SpO2 97%   BMI 29.16 kg/m  BP Readings from Last 3 Encounters:  11/13/24 120/78  03/19/24 125/86  02/13/24 113/72   Wt Readings from Last 3 Encounters:  11/13/24 215 lb (97.5 kg)  03/19/24 221 lb 6.4 oz (100.4 kg)  02/13/24 222 lb 1.9 oz (100.8 kg)      Physical Exam Vitals and nursing note reviewed.  Constitutional:      General: He is not in acute distress.    Appearance: Normal appearance. He is not ill-appearing, toxic-appearing or diaphoretic.  HENT:     Head: Normocephalic and atraumatic.     Right Ear: External ear normal.     Left Ear: External ear normal.     Nose: Nose normal.  Eyes:     General: No scleral icterus.    Pupils: Pupils are equal, round, and reactive to light.  Cardiovascular:  Rate and Rhythm: Normal rate.  Pulmonary:     Effort: Pulmonary effort is normal. No respiratory distress.  Skin:    General: Skin is warm and dry.     Findings: No erythema or rash.  Neurological:     General: No focal deficit present.     Mental Status: He is alert and oriented to person, place, and time.         11/13/2024   10:09 AM 02/13/2024   10:48 AM 09/27/2021   10:41 AM 09/21/2020   10:41 AM  GAD 7 : Generalized Anxiety Score  Nervous, Anxious, on Edge 3 1 2 2   Control/stop worrying 2 1 1 1   Worry too much - different things 2 1 1 1   Trouble relaxing 2 1 1 2   Restless 2 1 1 1   Easily annoyed or  irritable 1 1 2 1   Afraid - awful might happen 1 1 1 1   Total GAD 7 Score 13 7 9 9   Anxiety Difficulty  Somewhat difficult Somewhat difficult Somewhat difficult       11/13/2024   10:08 AM 02/13/2024   10:47 AM 12/12/2022   10:27 AM 09/27/2021   10:41 AM 09/21/2020   10:41 AM  Depression screen PHQ 2/9  Decreased Interest 2 1 0 1 1  Down, Depressed, Hopeless 1 1 0 1 1  PHQ - 2 Score 3 2 0 2 2  Altered sleeping 3 2  2 2   Tired, decreased energy 1 2  2 2   Change in appetite 1 2  1 1   Feeling bad or failure about yourself  1 1  1 1   Trouble concentrating 2 1  2 2   Moving slowly or fidgety/restless 1 1  1 1   Suicidal thoughts 0 0  0 0  PHQ-9 Score 12 11   11  11    Difficult doing work/chores  Somewhat difficult  Somewhat difficult Somewhat difficult     Data saved with a previous flowsheet row definition     No results found for any visits on 11/13/24.  Last CBC Lab Results  Component Value Date   WBC 4.9 02/13/2024   HGB 14.8 02/13/2024   HCT 45.5 02/13/2024   MCV 91 02/13/2024   MCH 29.5 02/13/2024   RDW 11.9 02/13/2024   PLT 221 02/13/2024   Last metabolic panel Lab Results  Component Value Date   GLUCOSE 116 (H) 02/13/2024   NA 140 02/13/2024   K 4.3 02/13/2024   CL 104 02/13/2024   CO2 22 02/13/2024   BUN 16 02/13/2024   CREATININE 0.89 02/13/2024   EGFR 106 02/13/2024   CALCIUM  9.7 02/13/2024   PROT 7.0 02/13/2024   ALBUMIN 5.0 02/13/2024   LABGLOB 2.0 02/13/2024   BILITOT 0.6 02/13/2024   ALKPHOS 52 02/13/2024   AST 21 02/13/2024   ALT 23 02/13/2024   Last lipids Lab Results  Component Value Date   CHOL 152 02/13/2024   HDL 53 02/13/2024   LDLCALC 71 02/13/2024   TRIG 168 (H) 02/13/2024   CHOLHDL 2.9 02/13/2024   Last hemoglobin A1c Lab Results  Component Value Date   HGBA1C 5.5 02/13/2024   Last thyroid functions Lab Results  Component Value Date   TSH 2.940 02/13/2024   T3TOTAL 92 09/23/2020   FREET4 1.1 09/27/2021   Last vitamin  D Lab Results  Component Value Date   VD25OH 26 (L) 09/23/2020   Last vitamin B12 and Folate No results found for: VITAMINB12, FOLATE  The 10-year ASCVD risk score (Arnett DK, et al., 2019) is: 1.4%  Assessment & Plan:  Sleep disorder -     traZODone  HCl; Take 1 tablet (100 mg total) by mouth at bedtime. Insomnia  Dispense: 90 tablet; Refill: 3  Mixed hyperlipidemia -     Rosuvastatin  Calcium ; Take 1 tablet (40 mg total) by mouth daily.  Dispense: 90 tablet; Refill: 3  Anxiety, generalized -     Escitalopram  Oxalate; Take 1 tablet (10 mg total) by mouth daily.  Dispense: 90 tablet; Refill: 3 -     ALPRAZolam ; Take 1 tablet (1 mg total) by mouth daily as needed. for anxiety  Dispense: 30 tablet; Refill: 0 -     busPIRone  HCl; Start with 1/2 tab once daily x 5 days, then increase to 1/2 tab twice daily x 5 days, then 1 tab in AM, 1/2 tab in PM x 5 days, then continue with 1 tab twice daily.  Dispense: 60 tablet; Refill: 3  Assessment and Plan    Generalized anxiety disorder Chronic anxiety with increased symptoms, score rose from 7 to 13. Anxiety occurs in waves, exacerbated by work schedule and lack of silence. Current treatment includes alprazolam  and Lexapro . - Prescribed buspirone  with slow taper to minimize headache side effects. - Continue alprazolam  as needed. - Continue Lexapro  as prescribed.  Sleep disorder Difficulty falling asleep due to work schedule and environment. Trazodone  effective for sleep. - Continue trazodone  as prescribed.  Mixed hyperlipidemia Cholesterol levels normal. Vascepa  no longer needed. - Continue rosuvastatin  (Crestor ) as prescribed.  General health maintenance Cologuard test negative in March, next test in three years. - Continue routine health maintenance with Cologuard every three years.         Return in about 3 months (around 02/13/2025) for Annual Physical.   Benton LITTIE Gave, PA

## 2024-11-13 NOTE — Patient Instructions (Addendum)
 Please add Buspirone to help with anxiety. Start with 1/2 tab once daily x 5 days, then increase to 1/2 tab twice daily x 5 days, then 1 tab in AM, 1/2 tab in PM x 5 days, then continue with 1 tab twice daily.   Please return in March for annual physical with fasting labs.

## 2024-12-12 ENCOUNTER — Other Ambulatory Visit: Payer: Self-pay

## 2024-12-12 DIAGNOSIS — F411 Generalized anxiety disorder: Secondary | ICD-10-CM

## 2024-12-12 MED ORDER — BUSPIRONE HCL 10 MG PO TABS: ORAL_TABLET | ORAL | 0 refills | Status: AC

## 2025-01-12 ENCOUNTER — Other Ambulatory Visit: Payer: Self-pay | Admitting: Urgent Care

## 2025-01-12 DIAGNOSIS — F411 Generalized anxiety disorder: Secondary | ICD-10-CM

## 2025-02-11 ENCOUNTER — Encounter: Admitting: Urgent Care
